# Patient Record
Sex: Female | Born: 1950 | Race: White | Hispanic: No | Marital: Married | Smoking: Current every day smoker
Health system: Southern US, Community
[De-identification: ages and names within clinical notes are randomized; demographics above are authoritative.]

## PROBLEM LIST (undated history)

## (undated) DIAGNOSIS — T7840XA Allergy, unspecified, initial encounter: Secondary | ICD-10-CM

## (undated) DIAGNOSIS — F32A Depression, unspecified: Secondary | ICD-10-CM

## (undated) DIAGNOSIS — K635 Polyp of colon: Secondary | ICD-10-CM

## (undated) DIAGNOSIS — F419 Anxiety disorder, unspecified: Secondary | ICD-10-CM

## (undated) DIAGNOSIS — F329 Major depressive disorder, single episode, unspecified: Secondary | ICD-10-CM

## (undated) HISTORY — DX: Polyp of colon: K63.5

## (undated) HISTORY — DX: Allergy, unspecified, initial encounter: T78.40XA

## (undated) HISTORY — DX: Anxiety disorder, unspecified: F41.9

---
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
## 1972-03-19 HISTORY — PX: GALLBLADDER SURGERY: SHX652

## 2004-06-05 ENCOUNTER — Ambulatory Visit: Payer: Self-pay | Admitting: Internal Medicine

## 2005-08-28 ENCOUNTER — Ambulatory Visit: Payer: Self-pay | Admitting: Internal Medicine

## 2007-10-15 ENCOUNTER — Ambulatory Visit: Payer: Self-pay | Admitting: Internal Medicine

## 2007-10-18 ENCOUNTER — Ambulatory Visit: Payer: Self-pay | Admitting: Internal Medicine

## 2007-10-27 ENCOUNTER — Ambulatory Visit: Payer: Self-pay | Admitting: Internal Medicine

## 2007-12-05 ENCOUNTER — Ambulatory Visit: Payer: Self-pay | Admitting: Urology

## 2007-12-08 ENCOUNTER — Ambulatory Visit: Payer: Self-pay | Admitting: Gastroenterology

## 2010-04-05 ENCOUNTER — Ambulatory Visit: Payer: Self-pay | Admitting: Internal Medicine

## 2011-04-24 ENCOUNTER — Ambulatory Visit: Payer: Self-pay | Admitting: Internal Medicine

## 2011-06-26 ENCOUNTER — Ambulatory Visit: Payer: Self-pay | Admitting: Gastroenterology

## 2011-06-29 LAB — PATHOLOGY REPORT

## 2015-08-29 DIAGNOSIS — Z720 Tobacco use: Secondary | ICD-10-CM | POA: Insufficient documentation

## 2016-08-22 LAB — HM HEPATITIS C SCREENING LAB: HM Hepatitis Screen: NEGATIVE

## 2017-08-20 DIAGNOSIS — M5136 Other intervertebral disc degeneration, lumbar region: Secondary | ICD-10-CM | POA: Diagnosis not present

## 2017-08-20 DIAGNOSIS — M9903 Segmental and somatic dysfunction of lumbar region: Secondary | ICD-10-CM | POA: Diagnosis not present

## 2017-08-20 DIAGNOSIS — M9901 Segmental and somatic dysfunction of cervical region: Secondary | ICD-10-CM | POA: Diagnosis not present

## 2017-09-03 ENCOUNTER — Other Ambulatory Visit: Payer: Self-pay

## 2017-09-03 ENCOUNTER — Ambulatory Visit: Payer: Medicare Other | Admitting: Family Medicine

## 2017-09-03 ENCOUNTER — Encounter: Payer: Self-pay | Admitting: Family Medicine

## 2017-09-03 VITALS — BP 136/57 | HR 72 | Temp 98.2°F | Ht 63.0 in | Wt 147.6 lb

## 2017-09-03 DIAGNOSIS — F3342 Major depressive disorder, recurrent, in full remission: Secondary | ICD-10-CM | POA: Diagnosis not present

## 2017-09-03 DIAGNOSIS — E7800 Pure hypercholesterolemia, unspecified: Secondary | ICD-10-CM

## 2017-09-03 DIAGNOSIS — Z7689 Persons encountering health services in other specified circumstances: Secondary | ICD-10-CM | POA: Diagnosis not present

## 2017-09-03 NOTE — Patient Instructions (Addendum)
Thank you for coming to the office today.  Keep taking Trazodone 100mg  nightly - when down to last 1-2 weeks of pills - please call OUR OFFICE to request a new rx sent to [facility]  We will re-discuss Pneumonia Vaccine - Prevnar-13 at next visit please consider this  Also we will plan to order Mammogram and Colonoscopy at next visit   DUE for FASTING BLOOD WORK (no food or drink after midnight before the lab appointment, only water or coffee without cream/sugar on the morning of)  SCHEDULE "Lab Only" visit in the morning at the clinic for lab draw [place]  - Make sure Lab Only appointment is at about 1 week before your next appointment, so that results will be available  For Lab Results, once available within 2-3 days of blood draw, you can can log in to MyChart online to view your results and a brief explanation. Also, we can discuss results at next follow-up visit.   Please schedule a Follow-up Appointment to: Return in about 2 weeks (around 09/17/2017) for Annual Physical.  If you have any other questions or concerns, please feel free to call the office or send a message through [place]. You may also schedule an earlier appointment if necessary.  Additionally, you may be receiving a survey about your experience at our office within a few days to 1 week by e-mail or mail. We value your feedback.  Nobie Putnam, DO [place]

## 2017-09-03 NOTE — Progress Notes (Signed)
Subjective:    Patient ID: Brittany Weeks, female    DOB: 1951-02-08, 67 y.o.   MRN: [id number]  Brittany Weeks is a 67 y.o. female presenting on 09/03/2017 for Establish Care (transition care from Dr Ginette Pitman ) and Insomnia  Establishing with new PCP here at [facility]. Patient preference transition from previous PCP Dr Ginette Pitman at [facility].  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 In past she tried Paxil in past did not do well on it, eventually switched tried Wellbutrin, Prozac - only temporary improvement and then would stop meds and symptoms would return with recurrent depression - Now she has been significantly well controlled on Trazodone 100mg  nightly, she wants to switch pharmacy to  - Poor sleep without Trazodone, has difficulty falling asleep - Not established with Psychiatry or Counselor / Therapist - not interested - Not due for refill at this time, has enough med - will notify when ready - Denies worsening mood, insomnia, anxiety, suicidal or homicidal ideation  HYPERLIPIDEMIA: - Reports concerns that she does not want to start cholesterol med. Last lipid panel 08/22/16, mild elevated total chol and LDL 133 - Not taking statin   Additional history - Previously treated 1 month ago with Amoxicillin about 1 month ago with abscess tooth, with history root canal - Admits some mild lower ankle swelling on outside of ankle bones, otherwise no swelling, has history of arthritis thinks that this is what is causing this, has some varicose/spider veins as well  Health Maintenance:  Decline TDAP  She will consider pneumonia vaccine / prevnar-13 now age >10  Colonoscopy last 2013 - had some hyperplastic polyps by path report - no  colonoscopy report available, previously performed by Dr Gustavo Lah at [facility]. She is overdue for next screening, will request repeat colonoscopy at next visit  Breast CA Screening: Due for mammogram screening. Last mammogram result normal (04/24/11) done at [facility]  program. No known family history of breast cancer. Currently asymptomatic.  Reported history of prior Pap smear 2016, DEXA 2014.   Depression screen [facility] 2/9 09/03/2017  Decreased Interest 0  Down, Depressed, Hopeless 0  PHQ - 2 Score 0  Altered sleeping 0  Tired, decreased energy 0  Change in appetite 0  Feeling bad or failure about yourself  0  Trouble concentrating 0  Moving slowly or fidgety/restless 0  Suicidal thoughts 0  PHQ-9 Score 0  Difficult doing work/chores Not difficult at all   GAD 7 : Generalized Anxiety Score 09/03/2017  Nervous, Anxious, on Edge 0  Control/stop worrying 0  Worry too much - different things 0  Trouble relaxing 0  Restless 0  Easily annoyed or irritable 0  Afraid - awful might happen 0  Total GAD 7 Score 0  Anxiety Difficulty Not difficult at all     Past Medical History:  Diagnosis Date  . Allergy   . Anxiety   . Colon polyps    Past Surgical History:  Procedure Laterality Date  . GALLBLADDER SURGERY  1974  . TONSILLECTOMY Bilateral 1960   Social History   Socioeconomic History  . Marital status: Married    Spouse name: Not on file  . Number of children: Not on file  . Years of education: High  School  . Highest education level: High school graduate  Occupational History  . Not on file  Social Needs  . Financial resource strain: Not on file  . Food insecurity:    Worry: Not on file    Inability: Not on file  . Transportation needs:    Medical: Not on file    Non-medical: Not on file  Tobacco Use  . Smoking status: Current Every Day Smoker    Packs/day: 1.00    Years: 50.00    Pack years: 50.00  . Smokeless tobacco: Never Used  Substance and Sexual Activity  .  Alcohol use: Not on file  . Drug use: Not on file  . Sexual activity: Not on file  Lifestyle  . Physical activity:    Days per week: Not on file    Minutes per session: Not on file  . Stress: Not on file  Relationships  . Social connections:    Talks on phone: Not on file    Gets together: Not on file    Attends religious service: Not on file    Active member of club or organization: Not on file    Attends meetings of clubs or organizations: Not on file    Relationship status: Not on file  . Intimate partner violence:    Fear of current or ex partner: Not on file    Emotionally abused: Not on file    Physically abused: Not on file    Forced sexual activity: Not on file  Other Topics Concern  . Not on file  Social History Narrative  . Not on file   Family History  Problem Relation Age of Onset  . Leukemia Mother   . Dementia Father    Current Outpatient Medications on File Prior to Visit  Medication Sig  . traZODone (DESYREL) 100 MG tablet TAKE 1 TABLET BY MOUTH EVERY DAY AT NIGHT   No current facility-administered medications on file prior to visit.     Review of Systems Per HPI unless specifically indicated above     Objective:    BP (!) 136/57 (BP Location: Right Arm, Patient Position: Sitting, Cuff Size: Normal)   Pulse 72   Temp 98.2 F (36.8 C) (Oral)   Ht 5\' 3"  (1.6 m)   Wt 147 lb 9.6 oz (67 kg)   BMI 26.15 kg/m   Wt Readings from Last 3 Encounters:  09/03/17 147 lb 9.6 oz (67 kg)    Physical Exam  Constitutional: She is oriented to person, place, and time. She appears well-developed and well-nourished. No distress.  Well-appearing, comfortable, cooperative  HENT:  Head: Normocephalic and atraumatic.  Mouth/Throat: Oropharynx is clear and moist.  Eyes: Conjunctivae are normal. Right eye exhibits no discharge. Left eye exhibits no discharge.  Cardiovascular: Normal rate and intact distal pulses.  Pulmonary/Chest: Effort normal and breath sounds  normal. She has no wheezes. She has no rales.  Musculoskeletal: She exhibits no edema (except mild trace non pitting soft edema localized only around lateral malleolus bilateral).  Neurological: She is alert and oriented to person, place, and time.  Skin: Skin is warm and dry. No rash noted. She is not diaphoretic. No erythema.  Evidnece of mild spider and varicose veins lower ext  Psychiatric: She has a normal mood and affect. Her behavior is normal.  Well groomed, good eye contact, normal speech and thoughts  Nursing note and vitals reviewed.  Results for orders placed or performed in visit on 09/03/17  HM  HEPATITIS C SCREENING LAB  Result Value Ref Range   HM Hepatitis Screen Negative-Validated       Assessment & Plan:   Problem List Items Addressed This Visit    Hyperlipidemia    Prior lab elevated LDL, seems inadequately controlled on lifestyle, other numbers were good on outside labs - Order future fasting lab for yearly comparison - Follow-up at annual to review - already she has preferred to avoid statin, will calculate ASCVD risk and re-discuss      Major depression, recurrent, full remission ([place]) - Primary    Stable well controlled on current therapy Insomnia seems secondary to mood/anxiety - also controlled PHQ and GAD score 0 Not followed by Psych or Therapist  Plan Continue Trazodone 100mg  nightly monotherapy Will refill when patient ready - avoid confusion will not send rx now, she has switched PCP and pharmacy now       Relevant Medications   traZODone (DESYREL) 100 MG tablet    Other Visit Diagnoses    Encounter to establish care with new doctor        Will review outside PCP records from [facility], no colonoscopy report is available, will either request or anticipate she will need new order soon and will update this    #LE Edema mild Briefly reviewed likely this is due to varicose veins/spider veins some insufficiency vs possible arthritis causing  localized surrounding joint/soft tissue swelling, seems benign with improve with rest elevation and worse if more active. - Follow-up course in future - consider more aggressive RICE therapy interventions, prefers to avoid medication  No orders of the defined types were placed in this encounter.   Follow up plan: Return in about 2 weeks (around 09/17/2017) for Annual Physical.  Future orders placed for 09/11/17  Nobie Putnam, [facility] 09/04/2017, 12:10 AM

## 2017-09-04 ENCOUNTER — Encounter: Payer: Self-pay | Admitting: Family Medicine

## 2017-09-04 ENCOUNTER — Other Ambulatory Visit: Payer: Self-pay | Admitting: Family Medicine

## 2017-09-04 DIAGNOSIS — E785 Hyperlipidemia, unspecified: Secondary | ICD-10-CM | POA: Insufficient documentation

## 2017-09-04 DIAGNOSIS — Z0000 Encounter for general adult medical examination without abnormal findings: Secondary | ICD-10-CM

## 2017-09-04 DIAGNOSIS — E782 Mixed hyperlipidemia: Secondary | ICD-10-CM | POA: Insufficient documentation

## 2017-09-04 DIAGNOSIS — E7800 Pure hypercholesterolemia, unspecified: Secondary | ICD-10-CM

## 2017-09-04 DIAGNOSIS — F3342 Major depressive disorder, recurrent, in full remission: Secondary | ICD-10-CM

## 2017-09-04 DIAGNOSIS — F5104 Psychophysiologic insomnia: Secondary | ICD-10-CM

## 2017-09-04 NOTE — Assessment & Plan Note (Signed)
Prior lab elevated LDL, seems inadequately controlled on lifestyle, other numbers were good on outside labs - Order future fasting lab for yearly comparison - Follow-up at annual to review - already she has preferred to avoid statin, will calculate ASCVD risk and re-discuss

## 2017-09-04 NOTE — Assessment & Plan Note (Signed)
Stable well controlled on current therapy Insomnia seems secondary to mood/anxiety - also controlled PHQ and GAD score 0 Not followed by Psych or Therapist  Plan Continue Trazodone 100mg  nightly monotherapy Will refill when patient ready - avoid confusion will not send rx now, she has switched PCP and pharmacy now

## 2017-09-11 ENCOUNTER — Other Ambulatory Visit: Payer: Medicare Other

## 2017-09-17 DIAGNOSIS — M9901 Segmental and somatic dysfunction of cervical region: Secondary | ICD-10-CM | POA: Diagnosis not present

## 2017-09-17 DIAGNOSIS — M9903 Segmental and somatic dysfunction of lumbar region: Secondary | ICD-10-CM | POA: Diagnosis not present

## 2017-09-17 DIAGNOSIS — M5136 Other intervertebral disc degeneration, lumbar region: Secondary | ICD-10-CM | POA: Diagnosis not present

## 2017-09-18 ENCOUNTER — Encounter: Payer: Medicare Other | Admitting: Family Medicine

## 2017-09-25 ENCOUNTER — Other Ambulatory Visit: Payer: Medicare Other

## 2017-09-25 DIAGNOSIS — R7309 Other abnormal glucose: Secondary | ICD-10-CM | POA: Diagnosis not present

## 2017-09-25 DIAGNOSIS — F3342 Major depressive disorder, recurrent, in full remission: Secondary | ICD-10-CM

## 2017-09-25 DIAGNOSIS — R7301 Impaired fasting glucose: Secondary | ICD-10-CM | POA: Diagnosis not present

## 2017-09-25 DIAGNOSIS — E7800 Pure hypercholesterolemia, unspecified: Secondary | ICD-10-CM | POA: Diagnosis not present

## 2017-09-25 DIAGNOSIS — Z0000 Encounter for general adult medical examination without abnormal findings: Secondary | ICD-10-CM | POA: Diagnosis not present

## 2017-09-26 LAB — CBC WITH DIFFERENTIAL/PLATELET
BASOS PCT: 0.8 %
Basophils Absolute: 62 cells/uL (ref 0–200)
EOS PCT: 1.9 %
Eosinophils Absolute: 148 cells/uL (ref 15–500)
HCT: 44.6 % (ref 35.0–45.0)
Hemoglobin: 14.6 g/dL (ref 11.7–15.5)
Lymphs Abs: 3151 cells/uL (ref 850–3900)
MCH: 32.3 pg (ref 27.0–33.0)
MCHC: 32.7 g/dL (ref 32.0–36.0)
MCV: 98.7 fL (ref 80.0–100.0)
MONOS PCT: 5.6 %
MPV: 10.6 fL (ref 7.5–12.5)
Neutro Abs: 4001 cells/uL (ref 1500–7800)
Neutrophils Relative %: 51.3 %
PLATELETS: 279 10*3/uL (ref 140–400)
RBC: 4.52 10*6/uL (ref 3.80–5.10)
RDW: 12.6 % (ref 11.0–15.0)
TOTAL LYMPHOCYTE: 40.4 %
WBC: 7.8 10*3/uL (ref 3.8–10.8)
WBCMIX: 437 {cells}/uL (ref 200–950)

## 2017-09-26 LAB — COMPLETE METABOLIC PANEL WITH GFR
AG Ratio: 2 (calc) (ref 1.0–2.5)
ALT: 10 U/L (ref 6–29)
AST: 14 U/L (ref 10–35)
Albumin: 4.5 g/dL (ref 3.6–5.1)
Alkaline phosphatase (APISO): 70 U/L (ref 33–130)
BUN: 13 mg/dL (ref 7–25)
CALCIUM: 9.5 mg/dL (ref 8.6–10.4)
CHLORIDE: 103 mmol/L (ref 98–110)
CO2: 28 mmol/L (ref 20–32)
Creat: 0.79 mg/dL (ref 0.50–0.99)
GFR, EST AFRICAN AMERICAN: 89 mL/min/{1.73_m2} (ref >=60)
GFR, Est Non African American: 77 mL/min/{1.73_m2} (ref >=60)
GLUCOSE: 103 mg/dL — AB (ref 65–99)
Globulin: 2.3 g/dL (calc) (ref 1.9–3.7)
Potassium: 4.7 mmol/L (ref 3.5–5.3)
Sodium: 140 mmol/L (ref 135–146)
TOTAL PROTEIN: 6.8 g/dL (ref 6.1–8.1)
Total Bilirubin: 0.4 mg/dL (ref 0.2–1.2)

## 2017-09-26 LAB — LIPID PANEL
CHOL/HDL RATIO: 3.4 (calc) (ref <5.0)
Cholesterol: 218 mg/dL — ABNORMAL HIGH (ref <200)
HDL: 64 mg/dL (ref >50)
LDL Cholesterol (Calc): 129 mg/dL (calc) — ABNORMAL HIGH
NON-HDL CHOLESTEROL (CALC): 154 mg/dL — AB (ref <130)
Triglycerides: 139 mg/dL (ref <150)

## 2017-09-26 LAB — HEMOGLOBIN A1C
EAG (MMOL/L): 5.8 (calc)
Hgb A1c MFr Bld: 5.3 % of total Hgb (ref <5.7)
MEAN PLASMA GLUCOSE: 105 (calc)

## 2017-10-02 ENCOUNTER — Encounter: Payer: Self-pay | Admitting: Family Medicine

## 2017-10-02 ENCOUNTER — Ambulatory Visit (INDEPENDENT_AMBULATORY_CARE_PROVIDER_SITE_OTHER): Payer: Medicare Other | Admitting: Family Medicine

## 2017-10-02 VITALS — BP 126/46 | HR 69 | Temp 99.2°F | Resp 16 | Ht 63.0 in | Wt 149.0 lb

## 2017-10-02 DIAGNOSIS — Z1239 Encounter for other screening for malignant neoplasm of breast: Secondary | ICD-10-CM

## 2017-10-02 DIAGNOSIS — Z23 Encounter for immunization: Secondary | ICD-10-CM | POA: Diagnosis not present

## 2017-10-02 DIAGNOSIS — F3342 Major depressive disorder, recurrent, in full remission: Secondary | ICD-10-CM | POA: Diagnosis not present

## 2017-10-02 DIAGNOSIS — E7800 Pure hypercholesterolemia, unspecified: Secondary | ICD-10-CM | POA: Diagnosis not present

## 2017-10-02 DIAGNOSIS — Z0000 Encounter for general adult medical examination without abnormal findings: Secondary | ICD-10-CM

## 2017-10-02 DIAGNOSIS — Z720 Tobacco use: Secondary | ICD-10-CM

## 2017-10-02 DIAGNOSIS — Z1231 Encounter for screening mammogram for malignant neoplasm of breast: Secondary | ICD-10-CM

## 2017-10-02 NOTE — Patient Instructions (Addendum)
Thank you for coming to the office today.  You will receive [id number] initial pneumonia vaccine today - then next due for booster vaccine in 1 year - Pneumovax-23 then done  Please call [facility] - Dr Marton Redwood office to schedule next routine screening colonoscopy - last one had polyps back in 2013  [address] [place], [place] [address] Hours: 8AM-5PM Phone: [phone]  ------------------------------  For Mammogram screening for breast cancer   DEXA Scan (Bone mineral density) screening for osteoporosis  Call the [place] below anytime to schedule your own appointment now that order has been placed.  [facility] [address], [place] [address] Phone: [phone]  When running low on Trazodone about 1-2 weeks before out of rx - call our office request a refill Trazodone 100mg  nightly - 90 day supply to [place] Dose Chest CT Lung CA Screening - Age 18-74 - Smoking history >30 pack years  [facility]) [place], RN, OCN [phone]  [place] Smoking Cessation Class Ph: [phone]  DUE for FASTING BLOOD WORK (no food or drink after midnight before the lab appointment, only water or coffee without cream/sugar on the morning of)  SCHEDULE "Lab Only" visit in the morning at the clinic for lab draw in 1 YEAR  - Make sure Lab Only appointment is at about 1 week before your next appointment, so that results will be available  For Lab Results, once available within 2-3 days of blood draw, you can can log in to MyChart online to view your results and a brief explanation. Also, we can discuss results at next follow-up visit.  Please schedule a Follow-up Appointment to: Return in about 1 year (around 10/03/2018) for Annual Physical.  If you have any other questions or concerns, please feel free to call the office or send a  message through [place]. You may also schedule an earlier appointment if necessary.  Additionally, you may be receiving a survey about your experience at our office within a few days to 1 week by e-mail or mail. We value your feedback.  Nobie Putnam, DO [place]

## 2017-10-02 NOTE — Progress Notes (Signed)
Subjective:    Patient ID: Brittany Weeks, female    DOB: Dec 25, 1950, 67 y.o.   MRN: [id number]  Brittany Weeks is a 67 y.o. 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 In past she tried Paxil in past did not do well on it, eventually switched tried Wellbutrin, Prozac - only temporary improvement and then would stop meds and symptoms would return with recurrent depression - Today reports no new concern. Doing well on Trazodone 100mg  nightly - Not established with Psychiatry or Counselor / Therapist - not interested  HYPERLIPIDEMIA: Last lipid 09/2017, elevated LDL mild, otherwise normal HDL and slightly high total chol Declines statin therapy Admits improved healthy diet, except does eat dessert often  Tobacco Abuse Chronic history smoker 1ppd >50 years. Not interested in quitting. Has not had lung CA screening with CT, not interested at this time.  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 Resolved after seen a Chiropractor, she thinks it was more "spine out of line". Sciatic nerve x 2-3 flare ups, followed with chiropractor, completed treatment course Followed by [facility] Chiropractor  Additional complaint - She had a root canal procedure on Lower Left side of jaw in May 2019, they used different technique, and now she has problem with feeling a "knot" on her jaw, initial mild swelling now  improving, she will return to Dentist   Health Maintenance:  Decline TDAP  Cervical Cancer Screening - no longer due, s/p last pap at age 71 negative, declines further screening  Due for initial pneumonia vaccine at age 38 - will receive Prevnar-13 today  Colonoscopy last 2013 - had some hyperplastic polyps by path report - no colonoscopy report available, previously performed by Dr Gustavo Lah at [facility]. She is overdue for next screening, will request repeat colonoscopy now she will contact them to schedule  Breast CA Screening: Due for mammogram screening. Last mammogram result normal (04/24/11) done at [facility]  program. No known family history of breast cancer. Currently asymptomatic.  Last DEXA 2014, she was previously treated with Fosamax back in 2004. Now declines further treatment or testing.   Depression screen [facility] 2/9 10/02/2017 09/03/2017  Decreased Interest 0 0  Down, Depressed, Hopeless 0 0  PHQ - 2 Score 0 0  Altered sleeping 0 0  Tired, decreased energy 0 0  Change in appetite 0 0  Feeling bad or failure about yourself  0 0  Trouble concentrating 0 0  Moving slowly or fidgety/restless 0 0  Suicidal thoughts 0 0  PHQ-9 Score 0 0  Difficult doing work/chores Not difficult at all Not difficult at all   GAD 7 : Generalized Anxiety Score 10/02/2017 09/03/2017  Nervous, Anxious, on Edge 0 0  Control/stop worrying 0 0  Worry too much - different things 0 0  Trouble relaxing 0 0  Restless 0 0  Easily annoyed or irritable 0 0  Afraid - awful might happen 0  0  Total GAD 7 Score 0 0  Anxiety Difficulty Not difficult at all Not difficult at all    Past Medical History:  Diagnosis Date  . Allergy   . Anxiety   . Colon polyps    Past Surgical History:  Procedure Laterality Date  . GALLBLADDER SURGERY  1974  . TONSILLECTOMY Bilateral 1960   Social History   Socioeconomic History  . Marital status: Married    Spouse name: Not on file  . Number of children: Not on file    . Years of education: [employer]  . Highest education level: High school graduate  Occupational History  . Not on file  Social Needs  . Financial resource strain: Not on file  . Food insecurity:    Worry: Not on file    Inability: Not on file  . Transportation needs:    Medical: Not on file    Non-medical: Not on file  Tobacco Use  . Smoking status: Current Every Day Smoker    Packs/day: 1.00    Years: 50.00    Pack years: 50.00  . Smokeless tobacco: Never Used  Substance and Sexual Activity  . Alcohol use: Not on file  . Drug use: Not on file  . Sexual activity: Not on file  Lifestyle  . Physical activity:    Days per week: Not on file    Minutes per session: Not on file  . Stress: Not on file  Relationships  . Social connections:    Talks on phone: Not on file    Gets together: Not on file    Attends religious service: Not on file    Active member of club or organization: Not on file    Attends meetings of clubs or organizations: Not on file    Relationship status: Not on file  . Intimate partner violence:    Fear of current or ex partner: Not on file    Emotionally abused: Not on file    Physically abused: Not on file    Forced sexual activity: Not on file  Other Topics Concern  . Not on file  Social History Narrative  . Not on file   Family History  Problem Relation Age of Onset  . Leukemia Mother   . Dementia Father    Current Outpatient Medications on File Prior to Visit  Medication Sig  . traZODone (DESYREL) 100 MG tablet TAKE 1 TABLET BY MOUTH EVERY DAY AT NIGHT   No current facility-administered medications on file prior to visit.     Review of Systems  Constitutional: Negative for activity change, appetite change, chills, diaphoresis, fatigue and fever.  HENT: Negative for congestion and hearing loss.   Eyes: Negative for visual disturbance.  Respiratory: Negative for apnea, cough, chest tightness, shortness of breath and wheezing.    Cardiovascular: Negative for chest pain, palpitations and leg swelling.  Gastrointestinal: Negative for abdominal pain, anal bleeding, blood in stool, constipation, diarrhea, nausea and vomiting.  Endocrine: Negative for cold intolerance.  Genitourinary: Negative for difficulty urinating, dysuria, frequency, hematuria and urgency.  Musculoskeletal: Negative for arthralgias, back pain and neck pain.  Skin: Negative for rash.  Allergic/Immunologic: Negative for environmental allergies.  Neurological: Negative for dizziness, weakness, light-headedness, numbness and headaches.  Hematological: Negative for adenopathy.  Psychiatric/Behavioral: Positive for sleep disturbance (controlled on med). Negative for behavioral problems and dysphoric mood. The patient is not nervous/anxious.    Per HPI unless specifically indicated above     Objective:  BP (!) 126/46   Pulse 69   Temp 99.2 F (37.3 C) (Oral)   Resp 16   Ht 5\' 3"  (1.6 m)   Wt 149 lb (67.6 kg)   BMI 26.39 kg/m   Wt Readings from Last 3 Encounters:  10/02/17 149 lb (67.6 kg)  09/03/17 147 lb 9.6 oz (67 kg)    Physical Exam  Constitutional: She is oriented to person, place, and time. She appears well-developed and well-nourished. No distress.  Well-appearing, comfortable, cooperative  HENT:  Head: Normocephalic and atraumatic.  Mouth/Throat: Oropharynx is clear and moist.  Eyes: Pupils are equal, round, and reactive to light. Conjunctivae and EOM are normal. Right eye exhibits no discharge. Left eye exhibits no discharge.  Neck: Normal range of motion. Neck supple. No thyromegaly present.  Cardiovascular: Normal rate, regular rhythm, normal heart sounds and intact distal pulses.  No murmur heard. Pulmonary/Chest: Effort normal and breath sounds normal. No respiratory distress. She has no wheezes. She has no rales.  Abdominal: Soft. Bowel sounds are normal. She exhibits no distension and no mass. There is no tenderness.   Musculoskeletal: Normal range of motion. She exhibits no edema (trace edema ankles only) or tenderness.  Upper / Lower Extremities: - Normal muscle tone, strength bilateral upper extremities 5/5, lower extremities 5/5  Lymphadenopathy:    She has no cervical adenopathy.  Neurological: She is alert and oriented to person, place, and time.  Distal sensation intact to light touch all extremities  Skin: Skin is warm and dry. No rash noted. She is not diaphoretic. No erythema.  Evidnece of mild spider and varicose veins lower ext  Psychiatric: She has a normal mood and affect. Her behavior is normal.  Well groomed, good eye contact, normal speech and thoughts  Nursing note and vitals reviewed.  Results for orders placed or performed in visit on 09/25/17  Lipid panel  Result Value Ref Range   Cholesterol 218 (H) <200 mg/dL   HDL 64 >50 mg/dL   Triglycerides 139 <150 mg/dL   LDL Cholesterol (Calc) 129 (H) mg/dL (calc)   Total CHOL/HDL Ratio 3.4 <5.0 (calc)   Non-HDL Cholesterol (Calc) 154 (H) <130 mg/dL (calc)  COMPLETE METABOLIC PANEL WITH GFR  Result Value Ref Range   Glucose, Bld 103 (H) 65 - 99 mg/dL   BUN 13 7 - 25 mg/dL   Creat 0.79 0.50 - 0.99 mg/dL   GFR, Est Non African American 77 > OR = 60 mL/min/1.41m2   GFR, Est African American 89 > OR = 60 mL/min/1.33m2   BUN/Creatinine Ratio NOT APPLICABLE 6 - 22 (calc)   Sodium 140 135 - 146 mmol/L   Potassium 4.7 3.5 - 5.3 mmol/L   Chloride 103 98 - 110 mmol/L   CO2 28 20 - 32 mmol/L   Calcium 9.5 8.6 - 10.4 mg/dL   Total Protein 6.8 6.1 - 8.1 g/dL   Albumin 4.5 3.6 - 5.1 g/dL   Globulin 2.3 1.9 - 3.7 g/dL (calc)   AG Ratio 2.0 1.0 - 2.5 (calc)   Total Bilirubin 0.4 0.2 - 1.2 mg/dL   Alkaline phosphatase (APISO) 70 33 - 130 U/L   AST 14 10 - 35 U/L   ALT 10 6 - 29 U/L  CBC with Differential/Platelet  Result Value Ref Range   WBC 7.8 3.8 - 10.8 Thousand/uL   RBC 4.52 3.80 - 5.10 Million/uL   Hemoglobin 14.6 11.7 - 15.5 g/dL    HCT 44.6 35.0 - 45.0 %  MCV 98.7 80.0 - 100.0 fL   MCH 32.3 27.0 - 33.0 pg   MCHC 32.7 32.0 - 36.0 g/dL   RDW 12.6 11.0 - 15.0 %   Platelets 279 140 - 400 Thousand/uL   MPV 10.6 7.5 - 12.5 fL   Neutro Abs 4,001 1,500 - 7,800 cells/uL   Lymphs Abs 3,151 850 - 3,900 cells/uL   WBC mixed population 437 200 - 950 cells/uL   Eosinophils Absolute 148 15 - 500 cells/uL   Basophils Absolute 62 0 - 200 cells/uL   Neutrophils Relative % 51.3 %   Total Lymphocyte 40.4 %   Monocytes Relative 5.6 %   Eosinophils Relative 1.9 %   Basophils Relative 0.8 %  Hemoglobin A1c  Result Value Ref Range   Hgb A1c MFr Bld 5.3 <5.7 % of total Hgb   Mean Plasma Glucose 105 (calc)   eAG (mmol/L) 5.8 (calc)      Assessment & Plan:   Problem List Items Addressed This Visit    Hyperlipidemia    Mildly Uncontrolled cholesterol elevated LDL but good HDL, improving lifestyle Last lipid panel 09/2017 Calculated ASCVD 10 yr risk score 13.2% (due to smoking, otherwise down to 7.8%)  Plan: 1. Counseling on ASCVD risk reduction with statin - she declines at this time. 2. Encourage improved lifestyle - low carb/cholesterol, reduce portion size, continue improving regular exercise      Major depression, recurrent, full remission ([place])    Stable well controlled on current therapy Insomnia seems secondary to mood/anxiety - also controlled PHQ and GAD score 0 Not followed by Psych or Therapist  Plan Continue Trazodone 100mg  nightly monotherapy Refill rx when ready Follow-up      Tobacco abuse    Counseling on risks of smoking Not interested in quitting No prior successful quit attempts Recommend future Low Dose CT Lung CA Screening - she is not interested at this time, handout given       Other Visit Diagnoses    Annual physical exam    -  Primary Updated Health Maintenance information - Ordered mammogram, patient to schedule - Recommended she contact [facility] for repeat colonoscopy - Declined  DEXA - Prevnar-13 given today Reviewed recent lab results with patient Encouraged improvement to lifestyle with diet and exercise    Screening for breast cancer       Relevant Orders   MM DIGITAL SCREENING BILATERAL   Need for vaccination with 13-polyvalent pneumococcal conjugate vaccine       Relevant Orders   Pneumococcal conjugate vaccine 13-valent IM (Completed)      No orders of the defined types were placed in this encounter.    Follow up plan: Return in about 1 year (around 10/03/2018) for Annual Physical.  Future labs ordered for 09/30/18  Nobie Putnam, [facility] [facility] 10/03/2017, 1:18 AM

## 2017-10-03 ENCOUNTER — Other Ambulatory Visit: Payer: Self-pay | Admitting: Family Medicine

## 2017-10-03 DIAGNOSIS — Z0000 Encounter for general adult medical examination without abnormal findings: Secondary | ICD-10-CM

## 2017-10-03 DIAGNOSIS — Z720 Tobacco use: Secondary | ICD-10-CM | POA: Insufficient documentation

## 2017-10-03 DIAGNOSIS — E7800 Pure hypercholesterolemia, unspecified: Secondary | ICD-10-CM

## 2017-10-03 DIAGNOSIS — R7309 Other abnormal glucose: Secondary | ICD-10-CM

## 2017-10-03 DIAGNOSIS — F3342 Major depressive disorder, recurrent, in full remission: Secondary | ICD-10-CM

## 2017-10-03 NOTE — Assessment & Plan Note (Signed)
Counseling on risks of smoking Not interested in quitting No prior successful quit attempts Recommend future Low Dose CT Lung CA Screening - she is not interested at this time, handout given

## 2017-10-03 NOTE — Assessment & Plan Note (Signed)
Stable well controlled on current therapy Insomnia seems secondary to mood/anxiety - also controlled PHQ and GAD score 0 Not followed by Psych or Therapist  Plan Continue Trazodone 100mg  nightly monotherapy Refill rx when ready Follow-up

## 2017-10-03 NOTE — Assessment & Plan Note (Signed)
Mildly Uncontrolled cholesterol elevated LDL but good HDL, improving lifestyle Last lipid panel 09/2017 Calculated ASCVD 10 yr risk score 13.2% (due to smoking, otherwise down to 7.8%)  Plan: 1. Counseling on ASCVD risk reduction with statin - she declines at this time. 2. Encourage improved lifestyle - low carb/cholesterol, reduce portion size, continue improving regular exercise

## 2017-10-22 DIAGNOSIS — M9901 Segmental and somatic dysfunction of cervical region: Secondary | ICD-10-CM | POA: Diagnosis not present

## 2017-10-22 DIAGNOSIS — M9905 Segmental and somatic dysfunction of pelvic region: Secondary | ICD-10-CM | POA: Diagnosis not present

## 2017-10-22 DIAGNOSIS — M9903 Segmental and somatic dysfunction of lumbar region: Secondary | ICD-10-CM | POA: Diagnosis not present

## 2017-10-22 DIAGNOSIS — M5136 Other intervertebral disc degeneration, lumbar region: Secondary | ICD-10-CM | POA: Diagnosis not present

## 2017-10-22 DIAGNOSIS — M40292 Other kyphosis, cervical region: Secondary | ICD-10-CM | POA: Diagnosis not present

## 2017-11-19 DIAGNOSIS — M9901 Segmental and somatic dysfunction of cervical region: Secondary | ICD-10-CM | POA: Diagnosis not present

## 2017-11-19 DIAGNOSIS — M9905 Segmental and somatic dysfunction of pelvic region: Secondary | ICD-10-CM | POA: Diagnosis not present

## 2017-11-19 DIAGNOSIS — M5136 Other intervertebral disc degeneration, lumbar region: Secondary | ICD-10-CM | POA: Diagnosis not present

## 2017-11-19 DIAGNOSIS — M40292 Other kyphosis, cervical region: Secondary | ICD-10-CM | POA: Diagnosis not present

## 2017-11-19 DIAGNOSIS — M9903 Segmental and somatic dysfunction of lumbar region: Secondary | ICD-10-CM | POA: Diagnosis not present

## 2017-11-27 ENCOUNTER — Other Ambulatory Visit: Payer: Self-pay | Admitting: Family Medicine

## 2017-11-27 DIAGNOSIS — F3342 Major depressive disorder, recurrent, in full remission: Secondary | ICD-10-CM

## 2017-11-27 MED ORDER — TRAZODONE HCL 100 MG PO TABS: ORAL_TABLET | ORAL | 3 refills | Status: DC

## 2017-11-27 NOTE — Addendum Note (Signed)
Addended by: Frederich Cha D on: 11/27/2017 04:44 PM   Modules accepted: Orders

## 2017-11-27 NOTE — Telephone Encounter (Signed)
Pt. Called requesting refill on  Trazodone  90 day supply called into  [place]

## 2017-12-17 DIAGNOSIS — M5136 Other intervertebral disc degeneration, lumbar region: Secondary | ICD-10-CM | POA: Diagnosis not present

## 2017-12-17 DIAGNOSIS — M9903 Segmental and somatic dysfunction of lumbar region: Secondary | ICD-10-CM | POA: Diagnosis not present

## 2017-12-17 DIAGNOSIS — M40292 Other kyphosis, cervical region: Secondary | ICD-10-CM | POA: Diagnosis not present

## 2017-12-17 DIAGNOSIS — M9901 Segmental and somatic dysfunction of cervical region: Secondary | ICD-10-CM | POA: Diagnosis not present

## 2017-12-17 DIAGNOSIS — M9905 Segmental and somatic dysfunction of pelvic region: Secondary | ICD-10-CM | POA: Diagnosis not present

## 2018-01-14 DIAGNOSIS — M40292 Other kyphosis, cervical region: Secondary | ICD-10-CM | POA: Diagnosis not present

## 2018-01-14 DIAGNOSIS — M5136 Other intervertebral disc degeneration, lumbar region: Secondary | ICD-10-CM | POA: Diagnosis not present

## 2018-01-14 DIAGNOSIS — M9901 Segmental and somatic dysfunction of cervical region: Secondary | ICD-10-CM | POA: Diagnosis not present

## 2018-01-14 DIAGNOSIS — M9903 Segmental and somatic dysfunction of lumbar region: Secondary | ICD-10-CM | POA: Diagnosis not present

## 2018-01-14 DIAGNOSIS — M9905 Segmental and somatic dysfunction of pelvic region: Secondary | ICD-10-CM | POA: Diagnosis not present

## 2018-02-11 DIAGNOSIS — M9905 Segmental and somatic dysfunction of pelvic region: Secondary | ICD-10-CM | POA: Diagnosis not present

## 2018-02-11 DIAGNOSIS — M9901 Segmental and somatic dysfunction of cervical region: Secondary | ICD-10-CM | POA: Diagnosis not present

## 2018-02-11 DIAGNOSIS — M9903 Segmental and somatic dysfunction of lumbar region: Secondary | ICD-10-CM | POA: Diagnosis not present

## 2018-02-11 DIAGNOSIS — M5136 Other intervertebral disc degeneration, lumbar region: Secondary | ICD-10-CM | POA: Diagnosis not present

## 2018-02-11 DIAGNOSIS — M40292 Other kyphosis, cervical region: Secondary | ICD-10-CM | POA: Diagnosis not present

## 2018-03-10 DIAGNOSIS — M5136 Other intervertebral disc degeneration, lumbar region: Secondary | ICD-10-CM | POA: Diagnosis not present

## 2018-03-10 DIAGNOSIS — M9901 Segmental and somatic dysfunction of cervical region: Secondary | ICD-10-CM | POA: Diagnosis not present

## 2018-03-10 DIAGNOSIS — M9905 Segmental and somatic dysfunction of pelvic region: Secondary | ICD-10-CM | POA: Diagnosis not present

## 2018-03-10 DIAGNOSIS — M40292 Other kyphosis, cervical region: Secondary | ICD-10-CM | POA: Diagnosis not present

## 2018-03-10 DIAGNOSIS — M9903 Segmental and somatic dysfunction of lumbar region: Secondary | ICD-10-CM | POA: Diagnosis not present

## 2018-04-07 DIAGNOSIS — M9905 Segmental and somatic dysfunction of pelvic region: Secondary | ICD-10-CM | POA: Diagnosis not present

## 2018-04-07 DIAGNOSIS — M5136 Other intervertebral disc degeneration, lumbar region: Secondary | ICD-10-CM | POA: Diagnosis not present

## 2018-04-07 DIAGNOSIS — M40292 Other kyphosis, cervical region: Secondary | ICD-10-CM | POA: Diagnosis not present

## 2018-04-07 DIAGNOSIS — M9901 Segmental and somatic dysfunction of cervical region: Secondary | ICD-10-CM | POA: Diagnosis not present

## 2018-04-07 DIAGNOSIS — M9903 Segmental and somatic dysfunction of lumbar region: Secondary | ICD-10-CM | POA: Diagnosis not present

## 2018-05-07 DIAGNOSIS — M5136 Other intervertebral disc degeneration, lumbar region: Secondary | ICD-10-CM | POA: Diagnosis not present

## 2018-05-07 DIAGNOSIS — M9905 Segmental and somatic dysfunction of pelvic region: Secondary | ICD-10-CM | POA: Diagnosis not present

## 2018-05-07 DIAGNOSIS — M40292 Other kyphosis, cervical region: Secondary | ICD-10-CM | POA: Diagnosis not present

## 2018-05-07 DIAGNOSIS — M9903 Segmental and somatic dysfunction of lumbar region: Secondary | ICD-10-CM | POA: Diagnosis not present

## 2018-05-07 DIAGNOSIS — M9901 Segmental and somatic dysfunction of cervical region: Secondary | ICD-10-CM | POA: Diagnosis not present

## 2018-09-30 ENCOUNTER — Other Ambulatory Visit: Payer: Medicare Other

## 2018-09-30 ENCOUNTER — Other Ambulatory Visit: Payer: Self-pay

## 2018-09-30 DIAGNOSIS — R7309 Other abnormal glucose: Secondary | ICD-10-CM

## 2018-09-30 DIAGNOSIS — Z0000 Encounter for general adult medical examination without abnormal findings: Secondary | ICD-10-CM

## 2018-09-30 DIAGNOSIS — F3342 Major depressive disorder, recurrent, in full remission: Secondary | ICD-10-CM

## 2018-09-30 DIAGNOSIS — E7800 Pure hypercholesterolemia, unspecified: Secondary | ICD-10-CM

## 2018-10-01 LAB — COMPLETE METABOLIC PANEL WITH GFR
AG Ratio: 1.8 (calc) (ref 1.0–2.5)
ALT: 11 U/L (ref 6–29)
AST: 13 U/L (ref 10–35)
Albumin: 4.5 g/dL (ref 3.6–5.1)
Alkaline phosphatase (APISO): 72 U/L (ref 37–153)
BUN: 10 mg/dL (ref 7–25)
CO2: 26 mmol/L (ref 20–32)
Calcium: 9.4 mg/dL (ref 8.6–10.4)
Chloride: 105 mmol/L (ref 98–110)
Creat: 0.74 mg/dL (ref 0.50–0.99)
GFR, Est African American: 96 mL/min/{1.73_m2} (ref >=60)
GFR, Est Non African American: 83 mL/min/{1.73_m2} (ref >=60)
Globulin: 2.5 g/dL (calc) (ref 1.9–3.7)
Glucose, Bld: 94 mg/dL (ref 65–99)
Potassium: 4.3 mmol/L (ref 3.5–5.3)
Sodium: 140 mmol/L (ref 135–146)
Total Bilirubin: 0.4 mg/dL (ref 0.2–1.2)
Total Protein: 7 g/dL (ref 6.1–8.1)

## 2018-10-01 LAB — CBC WITH DIFFERENTIAL/PLATELET
Absolute Monocytes: 559 {cells}/uL (ref 200–950)
Basophils Absolute: 60 {cells}/uL (ref 0–200)
Basophils Relative: 0.7 %
Eosinophils Absolute: 181 {cells}/uL (ref 15–500)
Eosinophils Relative: 2.1 %
HCT: 45.7 % — ABNORMAL HIGH (ref 35.0–45.0)
Hemoglobin: 14.7 g/dL (ref 11.7–15.5)
Lymphs Abs: 3638 {cells}/uL (ref 850–3900)
MCH: 32.2 pg (ref 27.0–33.0)
MCHC: 32.2 g/dL (ref 32.0–36.0)
MCV: 100.2 fL — ABNORMAL HIGH (ref 80.0–100.0)
MPV: 10.4 fL (ref 7.5–12.5)
Monocytes Relative: 6.5 %
Neutro Abs: 4162 {cells}/uL (ref 1500–7800)
Neutrophils Relative %: 48.4 %
Platelets: 289 10*3/uL (ref 140–400)
RBC: 4.56 Million/uL (ref 3.80–5.10)
RDW: 12.8 % (ref 11.0–15.0)
Total Lymphocyte: 42.3 %
WBC: 8.6 10*3/uL (ref 3.8–10.8)

## 2018-10-01 LAB — HEMOGLOBIN A1C
Hgb A1c MFr Bld: 5.5 % of total Hgb (ref <5.7)
Mean Plasma Glucose: 111 (calc)
eAG (mmol/L): 6.2 (calc)

## 2018-10-01 LAB — LIPID PANEL
Cholesterol: 214 mg/dL — ABNORMAL HIGH (ref <200)
HDL: 68 mg/dL (ref >=50)
LDL Cholesterol (Calc): 124 mg/dL (calc) — ABNORMAL HIGH
Non-HDL Cholesterol (Calc): 146 mg/dL (calc) — ABNORMAL HIGH (ref <130)
Total CHOL/HDL Ratio: 3.1 (calc) (ref <5.0)
Triglycerides: 112 mg/dL (ref <150)

## 2018-10-07 ENCOUNTER — Ambulatory Visit (INDEPENDENT_AMBULATORY_CARE_PROVIDER_SITE_OTHER): Payer: Medicare Other | Admitting: Family Medicine

## 2018-10-07 ENCOUNTER — Encounter: Payer: Self-pay | Admitting: Family Medicine

## 2018-10-07 ENCOUNTER — Other Ambulatory Visit: Payer: Self-pay | Admitting: Family Medicine

## 2018-10-07 ENCOUNTER — Other Ambulatory Visit: Payer: Self-pay

## 2018-10-07 VITALS — BP 125/53 | HR 77 | Temp 98.4°F | Resp 16 | Ht 63.0 in | Wt 155.0 lb

## 2018-10-07 DIAGNOSIS — Z0000 Encounter for general adult medical examination without abnormal findings: Secondary | ICD-10-CM

## 2018-10-07 DIAGNOSIS — E7800 Pure hypercholesterolemia, unspecified: Secondary | ICD-10-CM

## 2018-10-07 DIAGNOSIS — Z23 Encounter for immunization: Secondary | ICD-10-CM | POA: Diagnosis not present

## 2018-10-07 DIAGNOSIS — Z720 Tobacco use: Secondary | ICD-10-CM | POA: Diagnosis not present

## 2018-10-07 DIAGNOSIS — F3342 Major depressive disorder, recurrent, in full remission: Secondary | ICD-10-CM | POA: Diagnosis not present

## 2018-10-07 MED ORDER — TRAZODONE HCL 100 MG PO TABS: ORAL_TABLET | ORAL | 3 refills | Status: DC

## 2018-10-07 NOTE — Patient Instructions (Addendum)
Thank you for coming to the office today.  1. Chemistry - Normal results, including electrolytes, kidney and liver function. Normal fasting blood sugar   2. Hemoglobin A1c (Diabetes screening) - 5.5, normal not in range of Pre-Diabetes (>5.7 to 6.4)   3. Cholesterol - Mild elevated LDL cholesterol 124, otherwise normal.   4. CBC Blood Counts - Normal, no anemia, other abnormality   Colon Cancer Screening: - For all adults age 69+ routine colon cancer screening is highly recommended.     - Recent guidelines from [place] recommend starting age of 37 - Early detection of colon cancer is important, because often there are no warning signs or symptoms, also if found early usually it can be cured. Late stage is hard to treat.  - If you are not interested in Colonoscopy screening (if done and normal you could be cleared for 5 to 10 years until next due), then Cologuard is an excellent alternative for screening test for Colon Cancer. It is highly sensitive for detecting DNA of colon cancer from even the earliest stages. Also, there is NO bowel prep required. - If Cologuard is NEGATIVE, then it is good for 3 years before next due - If Cologuard is POSITIVE, then it is strongly advised to get a Colonoscopy, which allows the GI doctor to locate the source of the cancer or polyp (even very early stage) and treat it by removing it. ------------------------- If you would like to proceed with Cologuard (stool DNA test) - FIRST, call your insurance company and tell them you want to check cost of Cologuard tell them CPT Code [phone] (it may be completely covered and you could get for no cost, OR max cost without any coverage is about $600). Also, keep in mind if you do NOT open the kit, and decide not to do the test, you will NOT be charged, you should contact the company if you decide not to do the test. - If you want to proceed, you can notify us (phone message, [place], or at next visit)  and we will order it for you. The test kit will be delivered to you house within about 1 week. Follow instructions to collect sample, you may call the company for any help or questions, 24/7 telephone support at [phone].   DUE for FASTING BLOOD WORK (no food or drink after midnight before the lab appointment, only water or coffee without cream/sugar on the morning of)  SCHEDULE "Lab Only" visit in the morning at the clinic for lab draw in 1 YEAR  - Make sure Lab Only appointment is at about 1 week before your next appointment, so that results will be available  For Lab Results, once available within 2-3 days of blood draw, you can can log in to MyChart online to view your results and a brief explanation. Also, we can discuss results at next follow-up visit.    Please schedule a Follow-up Appointment to: Return in about 1 year (around 10/07/2019) for Annual Physical.  If you have any other questions or concerns, please feel free to call the office or send a message through [place]. You may also schedule an earlier appointment if necessary.  Additionally, you may be receiving a survey about your experience at our office within a few days to 1 week by e-mail or mail. We value your feedback.  Nobie Putnam, DO [place]

## 2018-10-07 NOTE — Progress Notes (Signed)
Subjective:    Patient ID: Brittany Weeks, female    DOB: Apr 20, 1950, 68 y.o.   MRN: [id number]  Brittany Weeks is a 68 y.o. 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 In past she tried Paxil in past did not do well on it, eventually switched tried Wellbutrin, Prozac - only temporary improvement and then would stop meds and symptoms would return with recurrent depression - Today reports no new concern. Doing well on Trazodone 100mg  nightly - Not established with Psychiatry or Counselor / Therapist - not interested  HYPERLIPIDEMIA: Last lipid 09/2018, elevated LDL mild, otherwise normal HDL and slightly high total chol Declines statin therapy again. Her husband takes statin and does well on it but she is not ready to take this. Admits improved healthy diet, except does eat dessert often  Tobacco Abuse Chronic history smoker 1ppd >50 years. Not interested in quitting. Has not had lung CA screening with CT, not interested at this time.   Health Maintenance:  Cervical Cancer Screening - no longer due, s/p last pap at age 32 negative, declines further screening  Due for initial pneumonia vaccine at age 68 - will receive booster Pneumvoax-28 today.  Colonoscopy last 2013 - had some hyperplasticpolyps by path report - no colonoscopy report available, previously performed by Dr Gustavo Lah at [facility]. She is overdue for next screening, she delayed until 05/2018 this year but then due to coronavirus pandemic unable to schedule and she declines to go to hospital = Declines cologuard due to hemorrhoids and bleeding at  times.  Breast CA Screening: Due for mammogram screening. [facility] No known family history of breast cancer. Currently asymptomatic. - Declines again for mammogram, due to risk of coronavirus at imaging center  Last DEXA 2014, she was previously treated with Fosamax back in 2004. Now declines further treatment or testing.  Depression screen [facility] 2/9 10/07/2018 10/02/2017 09/03/2017  Decreased Interest 0 0 0  Down, Depressed, Hopeless 0 0 0  PHQ - 2 Score 0 0 0  Altered sleeping 0 0 0  Tired, decreased energy 0 0 0  Change in appetite 0 0 0  Feeling bad or failure about yourself  0 0 0  Trouble concentrating 0 0 0  Moving slowly or fidgety/restless 0 0 0  Suicidal thoughts 0 0 0  PHQ-9 Score 0 0 0  Difficult doing work/chores Not difficult at all Not difficult at all Not difficult at all    GAD 7 : Generalized Anxiety Score 10/07/2018 10/02/2017 09/03/2017  Nervous, Anxious, on Edge 0 0 0  Control/stop worrying 0 0 0  Worry too much - different things 0 0 0  Trouble relaxing 0 0 0  Restless 0 0 0  Easily annoyed or irritable 0 0 0  Afraid - awful might happen 0 0 0  Total GAD 7 Score 0 0 0  Anxiety Difficulty Not difficult at all Not difficult at all Not difficult at all     Past Medical History:  Diagnosis Date  . Allergy   . Anxiety   . Colon polyps    Past Surgical History:  Procedure Laterality Date  . GALLBLADDER SURGERY  1974  . TONSILLECTOMY Bilateral 1960   Social History   Socioeconomic History  . Marital status: Married    Spouse name: Not on file  . Number of children: Not on file  . Years of education: [employer]  . Highest education level: High school graduate  Occupational History  . Not on file  Social Needs  . Financial resource strain: Not on file  . Food insecurity    Worry: Not on file    Inability: Not on file  . Transportation needs    Medical: Not on file    Non-medical: Not on file   Tobacco Use  . Smoking status: Current Every Day Smoker    Packs/day: 1.00    Years: 50.00    Pack years: 50.00  . Smokeless tobacco: Current User  Substance and Sexual Activity  . Alcohol use: Yes  . Drug use: Never  . Sexual activity: Not on file  Lifestyle  . Physical activity    Days per week: Not on file    Minutes per session: Not on file  . Stress: Not on file  Relationships  . Social Herbalist on phone: Not on file    Gets together: Not on file    Attends religious service: Not on file    Active member of club or organization: Not on file    Attends meetings of clubs or organizations: Not on file    Relationship status: Not on file  . Intimate partner violence    Fear of current or ex partner: Not on file    Emotionally abused: Not on file    Physically abused: Not on file    Forced sexual activity: Not on file  Other Topics Concern  . Not on file  Social History Narrative  . Not on file   Family History  Problem Relation Age of Onset  . Leukemia Mother   . Dementia Father    No current outpatient medications on file prior to visit.   No current facility-administered medications on file prior to visit.     Review of Systems  Constitutional: Negative for activity change, appetite change, chills, diaphoresis, fatigue and fever.  HENT: Negative for congestion and hearing loss.   Eyes: Negative for visual disturbance.  Respiratory: Negative for apnea, cough, chest tightness, shortness of breath and wheezing.   Cardiovascular: Negative for chest pain, palpitations and leg swelling.  Gastrointestinal: Negative for abdominal pain, anal bleeding, blood in stool, constipation, diarrhea, nausea and vomiting.       Hemorrhoids  Endocrine: Negative for cold intolerance.  Genitourinary: Negative for difficulty urinating, dysuria, frequency and hematuria.  Musculoskeletal: Negative for arthralgias, back pain and neck pain.  Skin: Negative for rash.   Allergic/Immunologic: Negative for environmental allergies.  Neurological: Negative for dizziness, weakness, light-headedness, numbness and headaches.  Hematological: Negative for adenopathy.  Psychiatric/Behavioral: Negative for behavioral problems, dysphoric mood and sleep disturbance. The patient is not nervous/anxious.    Per HPI unless specifically indicated above      Objective:    BP (!) 125/53   Pulse 77   Temp 98.4 F (36.9 C) (Oral)   Resp 16   Ht 5\' 3"  (1.6 m)   Wt 155 lb (70.3 kg)   BMI 27.46 kg/m   Wt Readings from Last 3 Encounters:  10/07/18 155 lb (70.3 kg)  10/02/17 149 lb (67.6 kg)  09/03/17 147 lb 9.6 oz (67 kg)  Physical Exam Vitals signs and nursing note reviewed.  Constitutional:      General: She is not in acute distress.    Appearance: She is well-developed. She is not diaphoretic.     Comments: Well-appearing, comfortable, cooperative  HENT:     Head: Normocephalic and atraumatic.  Eyes:     General:        Right eye: No discharge.        Left eye: No discharge.     Conjunctiva/sclera: Conjunctivae normal.     Pupils: Pupils are equal, round, and reactive to light.  Neck:     Musculoskeletal: Normal range of motion and neck supple.     Thyroid: No thyromegaly.  Cardiovascular:     Rate and Rhythm: Normal rate and regular rhythm.     Heart sounds: Normal heart sounds. No murmur.  Pulmonary:     Effort: Pulmonary effort is normal. No respiratory distress.     Breath sounds: Normal breath sounds. No wheezing or rales.  Abdominal:     General: Bowel sounds are normal. There is no distension.     Palpations: Abdomen is soft. There is no mass.     Tenderness: There is no abdominal tenderness.  Musculoskeletal: Normal range of motion.        General: No tenderness.     Right lower leg: No edema (trace ankles only).     Comments: Upper / Lower Extremities: - Normal muscle tone, strength bilateral upper extremities 5/5, lower extremities 5/5   Lymphadenopathy:     Cervical: No cervical adenopathy.  Skin:    General: Skin is warm and dry.     Findings: No erythema or rash.     Comments: Evidnece of mild spider and varicose veins lower ext   Neurological:     Mental Status: She is alert and oriented to person, place, and time.     Comments: Distal sensation intact to light touch all extremities  Psychiatric:        Behavior: Behavior normal.     Comments: Well groomed, good eye contact, normal speech and thoughts       Results for orders placed or performed in visit on 09/30/18  Lipid panel  Result Value Ref Range   Cholesterol 214 (H) <200 mg/dL   HDL 68 > OR = 50 mg/dL   Triglycerides 112 <150 mg/dL   LDL Cholesterol (Calc) 124 (H) mg/dL (calc)   Total CHOL/HDL Ratio 3.1 <5.0 (calc)   Non-HDL Cholesterol (Calc) 146 (H) <130 mg/dL (calc)  COMPLETE METABOLIC PANEL WITH GFR  Result Value Ref Range   Glucose, Bld 94 65 - 99 mg/dL   BUN 10 7 - 25 mg/dL   Creat 0.74 0.50 - 0.99 mg/dL   GFR, Est Non African American 83 > OR = 60 mL/min/1.62m2   GFR, Est African American 96 > OR = 60 mL/min/1.44m2   BUN/Creatinine Ratio NOT APPLICABLE 6 - 22 (calc)   Sodium 140 135 - 146 mmol/L   Potassium 4.3 3.5 - 5.3 mmol/L   Chloride 105 98 - 110 mmol/L   CO2 26 20 - 32 mmol/L   Calcium 9.4 8.6 - 10.4 mg/dL   Total Protein 7.0 6.1 - 8.1 g/dL   Albumin 4.5 3.6 - 5.1 g/dL   Globulin 2.5 1.9 - 3.7 g/dL (calc)   AG Ratio 1.8 1.0 - 2.5 (calc)   Total Bilirubin 0.4 0.2 - 1.2 mg/dL   Alkaline phosphatase (APISO) 72 37 - 153 U/L  AST 13 10 - 35 U/L   ALT 11 6 - 29 U/L  CBC with Differential/Platelet  Result Value Ref Range   WBC 8.6 3.8 - 10.8 Thousand/uL   RBC 4.56 3.80 - 5.10 Million/uL   Hemoglobin 14.7 11.7 - 15.5 g/dL   HCT 45.7 (H) 35.0 - 45.0 %   MCV 100.2 (H) 80.0 - 100.0 fL   MCH 32.2 27.0 - 33.0 pg   MCHC 32.2 32.0 - 36.0 g/dL   RDW 12.8 11.0 - 15.0 %   Platelets 289 140 - 400 Thousand/uL   MPV 10.4 7.5 - 12.5 fL    Neutro Abs 4,162 1,500 - 7,800 cells/uL   Lymphs Abs 3,638 850 - 3,900 cells/uL   Absolute Monocytes 559 200 - 950 cells/uL   Eosinophils Absolute 181 15 - 500 cells/uL   Basophils Absolute 60 0 - 200 cells/uL   Neutrophils Relative % 48.4 %   Total Lymphocyte 42.3 %   Monocytes Relative 6.5 %   Eosinophils Relative 2.1 %   Basophils Relative 0.7 %  Hemoglobin A1c  Result Value Ref Range   Hgb A1c MFr Bld 5.5 <5.7 % of total Hgb   Mean Plasma Glucose 111 (calc)   eAG (mmol/L) 6.2 (calc)      Assessment & Plan:   Problem List Items Addressed This Visit    Hyperlipidemia    Mildly Uncontrolled cholesterol elevated LDL but good HDL, improving lifestyle Last lipid panel 09/2018 Calculated ASCVD 10 yr risk score 13.2% (due to smoking, otherwise down to 7.8%)  Plan: 1. Counseling on ASCVD risk reduction with statin - she declines at this time, again - discussed in detail, she would only need low dose statin but she defers. 2. Encourage improved lifestyle - low carb/cholesterol, reduce portion size, continue improving regular exercise      Major depression, recurrent, full remission ([place])    Stable well controlled on current therapy Insomnia seems secondary to mood/anxiety - also controlled PHQ and GAD score 0 Not followed by Psych or Therapist  Plan Continue Trazodone 100mg  nightly monotherapy      Relevant Medications   traZODone (DESYREL) 100 MG tablet   Tobacco abuse    Counseling on risks of smoking Not interested in quitting No prior successful quit attempts Recommend future Low Dose CT Lung CA Screening - she is not interested at this time       Other Visit Diagnoses    Annual physical exam    -  Primary   Need for 23-polyvalent pneumococcal polysaccharide vaccine       Relevant Orders   Pneumococcal polysaccharide vaccine 23-valent greater than or equal to 2yo subcutaneous/IM (Completed)      Updated Health Maintenance information - Offered multiple screening  tests Mammogram, Colonoscopy vs Cologuard, DEXA, CT Lung declines all at this time, not interested to go to hospital / testing facility due to coronavirus risk, in past she has also declined will reconsider in future. - Declines tobacco cessation Reviewed recent lab results with patient Encouraged improvement to lifestyle with diet and exercise - Goal of weight loss    Meds ordered this encounter  Medications  . traZODone (DESYREL) 100 MG tablet    Sig: TAKE 1 TABLET BY MOUTH EVERY DAY AT NIGHT    Dispense:  90 tablet    Refill:  3    Follow up plan: Return in about 1 year (around 10/07/2019) for Annual Physical.  Future labs ordered.  Nobie Putnam, DO [facility] 10/07/2018, 2:19 PM

## 2018-10-07 NOTE — Assessment & Plan Note (Signed)
Mildly Uncontrolled cholesterol elevated LDL but good HDL, improving lifestyle Last lipid panel 09/2018 Calculated ASCVD 10 yr risk score 13.2% (due to smoking, otherwise down to 7.8%)  Plan: 1. Counseling on ASCVD risk reduction with statin - she declines at this time, again - discussed in detail, she would only need low dose statin but she defers. 2. Encourage improved lifestyle - low carb/cholesterol, reduce portion size, continue improving regular exercise

## 2018-10-07 NOTE — Assessment & Plan Note (Signed)
Stable well controlled on current therapy Insomnia seems secondary to mood/anxiety - also controlled PHQ and GAD score 0 Not followed by Psych or Therapist  Plan Continue Trazodone 100mg nightly monotherapy 

## 2018-10-07 NOTE — Assessment & Plan Note (Addendum)
Counseling on risks of smoking Not interested in quitting No prior successful quit attempts Recommend future Low Dose CT Lung CA Screening - she is not interested at this time 

## 2019-08-27 ENCOUNTER — Other Ambulatory Visit: Payer: Self-pay | Admitting: Internal Medicine

## 2019-08-27 ENCOUNTER — Other Ambulatory Visit: Payer: Self-pay | Admitting: Family Medicine

## 2019-08-27 DIAGNOSIS — Z1231 Encounter for screening mammogram for malignant neoplasm of breast: Secondary | ICD-10-CM

## 2019-09-01 ENCOUNTER — Ambulatory Visit
Admission: RE | Admit: 2019-09-01 | Discharge: 2019-09-01 | Disposition: A | Discharge status: Home / Self Care | Payer: Medicare Other | Source: Ambulatory Visit | Attending: Family Medicine | Admitting: Family Medicine

## 2019-09-01 DIAGNOSIS — Z1231 Encounter for screening mammogram for malignant neoplasm of breast: Secondary | ICD-10-CM | POA: Diagnosis not present

## 2019-09-07 ENCOUNTER — Telehealth: Payer: Self-pay | Admitting: Family Medicine

## 2019-09-07 NOTE — Telephone Encounter (Signed)
Patient was scheduled for labs one week prior to CPE on 10/01/19. Can orders for labs be placed?

## 2019-09-22 DIAGNOSIS — H2513 Age-related nuclear cataract, bilateral: Secondary | ICD-10-CM | POA: Diagnosis not present

## 2019-10-01 ENCOUNTER — Other Ambulatory Visit: Payer: Medicare Other

## 2019-10-01 ENCOUNTER — Other Ambulatory Visit: Payer: Self-pay

## 2019-10-01 DIAGNOSIS — Z0000 Encounter for general adult medical examination without abnormal findings: Secondary | ICD-10-CM

## 2019-10-01 DIAGNOSIS — R7989 Other specified abnormal findings of blood chemistry: Secondary | ICD-10-CM | POA: Diagnosis not present

## 2019-10-01 DIAGNOSIS — R718 Other abnormality of red blood cells: Secondary | ICD-10-CM | POA: Diagnosis not present

## 2019-10-01 DIAGNOSIS — E785 Hyperlipidemia, unspecified: Secondary | ICD-10-CM | POA: Diagnosis not present

## 2019-10-01 DIAGNOSIS — R7309 Other abnormal glucose: Secondary | ICD-10-CM | POA: Diagnosis not present

## 2019-10-02 LAB — COMPLETE METABOLIC PANEL WITHOUT GFR
AG Ratio: 1.8 (calc) (ref 1.0–2.5)
ALT: 9 U/L (ref 6–29)
AST: 11 U/L (ref 10–35)
Albumin: 4.2 g/dL (ref 3.6–5.1)
Alkaline phosphatase (APISO): 66 U/L (ref 37–153)
BUN: 9 mg/dL (ref 7–25)
CO2: 28 mmol/L (ref 20–32)
Calcium: 9.3 mg/dL (ref 8.6–10.4)
Chloride: 104 mmol/L (ref 98–110)
Creat: 0.76 mg/dL (ref 0.60–0.93)
GFR, Est African American: 92 mL/min/{1.73_m2}
GFR, Est Non African American: 79 mL/min/{1.73_m2}
Globulin: 2.4 g/dL (ref 1.9–3.7)
Glucose, Bld: 92 mg/dL (ref 65–99)
Potassium: 4.5 mmol/L (ref 3.5–5.3)
Sodium: 141 mmol/L (ref 135–146)
Total Bilirubin: 0.4 mg/dL (ref 0.2–1.2)
Total Protein: 6.6 g/dL (ref 6.1–8.1)

## 2019-10-02 LAB — CBC WITH DIFFERENTIAL/PLATELET
Absolute Monocytes: 498 {cells}/uL (ref 200–950)
Basophils Absolute: 58 {cells}/uL (ref 0–200)
Basophils Relative: 0.7 %
Eosinophils Absolute: 199 {cells}/uL (ref 15–500)
Eosinophils Relative: 2.4 %
HCT: 44.5 % (ref 35.0–45.0)
Hemoglobin: 14.3 g/dL (ref 11.7–15.5)
Lymphs Abs: 3013 {cells}/uL (ref 850–3900)
MCH: 32.6 pg (ref 27.0–33.0)
MCHC: 32.1 g/dL (ref 32.0–36.0)
MCV: 101.4 fL — ABNORMAL HIGH (ref 80.0–100.0)
MPV: 10.6 fL (ref 7.5–12.5)
Monocytes Relative: 6 %
Neutro Abs: 4532 {cells}/uL (ref 1500–7800)
Neutrophils Relative %: 54.6 %
Platelets: 284 10*3/uL (ref 140–400)
RBC: 4.39 Million/uL (ref 3.80–5.10)
RDW: 12.5 % (ref 11.0–15.0)
Total Lymphocyte: 36.3 %
WBC: 8.3 10*3/uL (ref 3.8–10.8)

## 2019-10-02 LAB — LIPID PANEL
Cholesterol: 208 mg/dL — ABNORMAL HIGH
HDL: 64 mg/dL
LDL Cholesterol (Calc): 122 mg/dL — ABNORMAL HIGH
Non-HDL Cholesterol (Calc): 144 mg/dL — ABNORMAL HIGH
Total CHOL/HDL Ratio: 3.3 (calc)
Triglycerides: 117 mg/dL

## 2019-10-02 LAB — HEMOGLOBIN A1C
Hgb A1c MFr Bld: 5.4 %{Hb}
Mean Plasma Glucose: 108 (calc)
eAG (mmol/L): 6 (calc)

## 2019-10-08 ENCOUNTER — Other Ambulatory Visit: Payer: Self-pay | Admitting: Family Medicine

## 2019-10-08 ENCOUNTER — Ambulatory Visit (INDEPENDENT_AMBULATORY_CARE_PROVIDER_SITE_OTHER): Payer: Medicare Other | Admitting: Family Medicine

## 2019-10-08 ENCOUNTER — Encounter: Payer: Self-pay | Admitting: Family Medicine

## 2019-10-08 ENCOUNTER — Other Ambulatory Visit: Payer: Self-pay

## 2019-10-08 VITALS — BP 121/45 | HR 88 | Temp 98.2°F | Resp 16 | Ht 63.0 in | Wt 149.6 lb

## 2019-10-08 DIAGNOSIS — Z720 Tobacco use: Secondary | ICD-10-CM | POA: Diagnosis not present

## 2019-10-08 DIAGNOSIS — Z0000 Encounter for general adult medical examination without abnormal findings: Secondary | ICD-10-CM

## 2019-10-08 DIAGNOSIS — R7309 Other abnormal glucose: Secondary | ICD-10-CM

## 2019-10-08 DIAGNOSIS — E7800 Pure hypercholesterolemia, unspecified: Secondary | ICD-10-CM

## 2019-10-08 DIAGNOSIS — F3342 Major depressive disorder, recurrent, in full remission: Secondary | ICD-10-CM

## 2019-10-08 NOTE — Assessment & Plan Note (Signed)
Stable well controlled on current therapy Insomnia seems secondary to mood/anxiety - also controlled PHQ and GAD score 0 Not followed by Psych or Therapist  Plan Continue Trazodone 100mg  nightly monotherapy

## 2019-10-08 NOTE — Assessment & Plan Note (Signed)
Counseling on risks of smoking Not interested in quitting No prior successful quit attempts Recommend future Low Dose CT Lung CA Screening - she is not interested at this time

## 2019-10-08 NOTE — Assessment & Plan Note (Signed)
Mildly Uncontrolled cholesterol elevated LDL but good HDL, improving lifestyle still Last lipid panel 09/2019 Calculated ASCVD 10 yr risk score >13% (due to smoking, otherwise down to 7-8%)  Plan: 1. Counseling on ASCVD risk reduction with statin - she declines again 2. Encourage improved lifestyle - low carb/cholesterol, reduce portion size, continue improving regular exercise

## 2019-10-08 NOTE — Patient Instructions (Addendum)
Thank you for coming to the office today.  \1. Chemistry - Normal results, including electrolytes, kidney and liver function. Normal fasting blood sugar   2. Hemoglobin A1c (Diabetes screening) - 5.4, normal not in range of Pre-Diabetes (>5.7 to 6.4)   3. Cholesterol - Mild Abnormal cholesterol results. normal HDL (good cholesterol), elevated 122 similar to prior 124-129 LDL (bad cholesterol), and normal Triglycerides. Consider initiating Statin therapy.  5. CBC Blood Counts - Normal except elevated MCV 101, no anemia, other abnormality  Refilled Trazodone 90 day for 1 year  Leg cramps - Try spoonful of yellow mustard to relieve leg cramps or try daily to prevent the problem  - OTC natural option is Hyland's Leg Cramps (Dissolving tablet) take as needed for muscle cramps  Varicose veins and minor swelling of legs is normal.   DUE for FASTING BLOOD WORK (no food or drink after midnight before the lab appointment, only water or coffee without cream/sugar on the morning of)  SCHEDULE "Lab Only" visit in the morning at the clinic for lab draw in 1 YEAR  - Make sure Lab Only appointment is at about 1 week before your next appointment, so that results will be available  For Lab Results, once available within 2-3 days of blood draw, you can can log in to MyChart online to view your results and a brief explanation. Also, we can discuss results at next follow-up visit.   Please schedule a Follow-up Appointment to: Return in about 1 year (around 10/07/2020) for Annual Physical.  If you have any other questions or concerns, please feel free to call the office or send a message through [place]. You may also schedule an earlier appointment if necessary.  Additionally, you may be receiving a survey about your experience at our office within a few days to 1 week by e-mail or mail. We value your feedback.  Nobie Putnam, DO [place]

## 2019-10-08 NOTE — Progress Notes (Signed)
Subjective:    Patient ID: Brittany Weeks, female    DOB: 1950-04-05, 69 y.o.   MRN: [id number]  Brittany Weeks is a 69 y.o. 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 Doing well on Trazodone 100mg  nightly - Not established with Psychiatry or Counselor / Therapist - not interested  HYPERLIPIDEMIA: Last lipid 09/2019, elevated LDL mild but improved, otherwise normal HDL Declines statin therapy again Admits improved healthy diet, except does eat dessert often  Tobacco Abuse Chronic history smoker 1ppd >50 years. Not interested in quitting. Has not had lung CA screening with CT, not interested at this time.   Health Maintenance:  Cervical Cancer Screening - no longer due, s/p last pap at age 27 negative, declines further screening  Not updated on COVID19 vaccine.  Colonoscopy last 2013 - had some hyperplasticpolyps by path report - no colonoscopy report available, previously performed by Dr Gustavo Lah at [place] was delayed in 2020 due to covid - now scheduled for August 2021 with [facility] Dr Alice Reichert  Breast CA Screening: Due for mammogram screening. [facility] Known family history with 4 year old daughter with breast cancer. Currently asymptomatic.  LastDEXA 2014, she was previously treated with Fosamax back in 2004.Now declines further treatment or testing.   Depression screen [facility] 2/9 10/08/2019 10/07/2018 10/02/2017  Decreased Interest 0 0 0  Down, Depressed, Hopeless 0 0 0  PHQ - 2 Score 0 0 0  Altered sleeping 0 0 0  Tired, decreased  energy 0 0 0  Change in appetite 0 0 0  Feeling bad or failure about yourself  0 0 0  Trouble concentrating 0 0 0  Moving slowly or fidgety/restless 0 0 0  Suicidal thoughts 0 0 0  PHQ-9 Score 0 0 0  Difficult doing work/chores Not difficult at all Not difficult at all Not difficult at all    Past Medical History:  Diagnosis Date  . Allergy   . Anxiety   . Colon polyps    Past Surgical History:  Procedure Laterality Date  . GALLBLADDER SURGERY  1974  . TONSILLECTOMY Bilateral 1960   Social History   Socioeconomic History  . Marital status: Married    Spouse name: Not on file  . Number of children: Not on file  . Years of education: [employer]  . Highest education level: High school graduate  Occupational History  . Not on file  Tobacco Use  . Smoking status: Current Every Day Smoker    Packs/day: 1.00    Years: 50.00    Pack years: 50.00  . Smokeless tobacco: Current User  Vaping Use  . Vaping Use: Never used  Substance and Sexual Activity  . Alcohol use: Yes  . Drug use: Never  . Sexual activity: Not on file  Other Topics Concern  . Not on file  Social History Narrative  . Not on file   Social Determinants of Health   Financial Resource Strain:   . Difficulty of Paying Living Expenses:   Food Insecurity:   . Worried About Charity fundraiser in the Last Year:   .  Ran Out of Food in the Last Year:   Transportation Needs:   . Film/video editor (Medical):   Marland Kitchen Lack of Transportation (Non-Medical):   Physical Activity:   . Days of Exercise per Week:   . Minutes of Exercise per Session:   Stress:   . Feeling of Stress :   Social Connections:   . Frequency of Communication with Friends and Family:   . Frequency of Social Gatherings with Friends and Family:   . Attends Religious Services:   . Active Member of Clubs or Organizations:   . Attends Archivist Meetings:   Marland Kitchen Marital Status:   Intimate Partner Violence:   . Fear of Current or  Ex-Partner:   . Emotionally Abused:   Marland Kitchen Physically Abused:   . Sexually Abused:    Family History  Problem Relation Age of Onset  . Leukemia Mother   . Dementia Father   . Breast cancer Daughter    Current Outpatient Medications on File Prior to Visit  Medication Sig  . traZODone (DESYREL) 100 MG tablet TAKE 1 TABLET BY MOUTH EVERY DAY AT NIGHT   No current facility-administered medications on file prior to visit.    Review of Systems  Constitutional: Negative for activity change, appetite change, chills, diaphoresis, fatigue and fever.  HENT: Negative for congestion and hearing loss.   Eyes: Negative for visual disturbance.  Respiratory: Negative for apnea, cough, chest tightness, shortness of breath and wheezing.   Cardiovascular: Negative for chest pain, palpitations and leg swelling.  Gastrointestinal: Negative for abdominal pain, anal bleeding, blood in stool, constipation, diarrhea, nausea and vomiting.  Endocrine: Negative for cold intolerance.  Genitourinary: Negative for difficulty urinating, dysuria, frequency and hematuria.  Musculoskeletal: Negative for arthralgias, back pain and neck pain.  Skin: Negative for rash.  Allergic/Immunologic: Negative for environmental allergies.  Neurological: Negative for dizziness, weakness, light-headedness, numbness and headaches.  Hematological: Negative for adenopathy.  Psychiatric/Behavioral: Negative for behavioral problems, dysphoric mood and sleep disturbance. The patient is not nervous/anxious.    Per HPI unless specifically indicated above     Objective:    BP (!) 121/45   Pulse 88   Temp 98.2 F (36.8 C) (Temporal)   Resp 16   Ht 5\' 3"  (1.6 m)   Wt 149 lb 9.6 oz (67.9 kg)   SpO2 97%   BMI 26.50 kg/m   Wt Readings from Last 3 Encounters:  10/08/19 149 lb 9.6 oz (67.9 kg)  10/07/18 155 lb (70.3 kg)  10/02/17 149 lb (67.6 kg)    Physical Exam Vitals and nursing note reviewed.  Constitutional:      General:  She is not in acute distress.    Appearance: She is well-developed. She is not diaphoretic.     Comments: Well-appearing, comfortable, cooperative  HENT:     Head: Normocephalic and atraumatic.  Eyes:     General:        Right eye: No discharge.        Left eye: No discharge.     Conjunctiva/sclera: Conjunctivae normal.     Pupils: Pupils are equal, round, and reactive to light.  Neck:     Thyroid: No thyromegaly.  Cardiovascular:     Rate and Rhythm: Normal rate and regular rhythm.     Heart sounds: Normal heart sounds. No murmur heard.   Pulmonary:     Effort: Pulmonary effort is normal. No respiratory distress.     Breath sounds: Normal breath sounds. No wheezing  or rales.  Abdominal:     General: Bowel sounds are normal. There is no distension.     Palpations: Abdomen is soft. There is no mass.     Tenderness: There is no abdominal tenderness.  Musculoskeletal:        General: No tenderness. Normal range of motion.     Cervical back: Normal range of motion and neck supple.     Right lower leg: No edema (trace ankles only).     Comments: Upper / Lower Extremities: - Normal muscle tone, strength bilateral upper extremities 5/5, lower extremities 5/5  Lymphadenopathy:     Cervical: No cervical adenopathy.  Skin:    General: Skin is warm and dry.     Findings: No erythema or rash.     Comments: Evidnece of mild spider and varicose veins lower ext   Neurological:     Mental Status: She is alert and oriented to person, place, and time.     Comments: Distal sensation intact to light touch all extremities  Psychiatric:        Behavior: Behavior normal.     Comments: Well groomed, good eye contact, normal speech and thoughts      CLINICAL DATA:  Screening.  EXAM: DIGITAL SCREENING BILATERAL MAMMOGRAM WITH TOMO AND CAD  COMPARISON:  Previous exam(s).  ACR Breast Density Category b: There are scattered areas of fibroglandular density.  FINDINGS: There are no  findings suspicious for malignancy. Images were processed with CAD.  IMPRESSION: No mammographic evidence of malignancy. A result letter of this screening mammogram will be mailed directly to the patient.  RECOMMENDATION: Screening mammogram in one year. (Code:SM-B-01Y)  BI-RADS CATEGORY  1: Negative.   Electronically Signed   By: Ammie Ferrier M.D.   On: 09/03/2019 09:48  Results for orders placed or performed in visit on 10/01/19  Lipid panel  Result Value Ref Range   Cholesterol 208 (H) <200 mg/dL   HDL 64 > OR = 50 mg/dL   Triglycerides 117 <150 mg/dL   LDL Cholesterol (Calc) 122 (H) mg/dL (calc)   Total CHOL/HDL Ratio 3.3 <5.0 (calc)   Non-HDL Cholesterol (Calc) 144 (H) <130 mg/dL (calc)  COMPLETE METABOLIC PANEL WITH GFR  Result Value Ref Range   Glucose, Bld 92 65 - 99 mg/dL   BUN 9 7 - 25 mg/dL   Creat 0.76 0.60 - 0.93 mg/dL   GFR, Est Non African American 79 > OR = 60 mL/min/1.9m2   GFR, Est African American 92 > OR = 60 mL/min/1.68m2   BUN/Creatinine Ratio NOT APPLICABLE 6 - 22 (calc)   Sodium 141 135 - 146 mmol/L   Potassium 4.5 3.5 - 5.3 mmol/L   Chloride 104 98 - 110 mmol/L   CO2 28 20 - 32 mmol/L   Calcium 9.3 8.6 - 10.4 mg/dL   Total Protein 6.6 6.1 - 8.1 g/dL   Albumin 4.2 3.6 - 5.1 g/dL   Globulin 2.4 1.9 - 3.7 g/dL (calc)   AG Ratio 1.8 1.0 - 2.5 (calc)   Total Bilirubin 0.4 0.2 - 1.2 mg/dL   Alkaline phosphatase (APISO) 66 37 - 153 U/L   AST 11 10 - 35 U/L   ALT 9 6 - 29 U/L  CBC with Differential/Platelet  Result Value Ref Range   WBC 8.3 3.8 - 10.8 Thousand/uL   RBC 4.39 3.80 - 5.10 Million/uL   Hemoglobin 14.3 11.7 - 15.5 g/dL   HCT 44.5 35 - 45 %   MCV 101.4 (  H) 80.0 - 100.0 fL   MCH 32.6 27.0 - 33.0 pg   MCHC 32.1 32.0 - 36.0 g/dL   RDW 12.5 11.0 - 15.0 %   Platelets 284 140 - 400 Thousand/uL   MPV 10.6 7.5 - 12.5 fL   Neutro Abs 4,532 1,500 - 7,800 cells/uL   Lymphs Abs 3,013 850 - 3,900 cells/uL   Absolute Monocytes 498  200 - 950 cells/uL   Eosinophils Absolute 199 15 - 500 cells/uL   Basophils Absolute 58 0 - 200 cells/uL   Neutrophils Relative % 54.6 %   Total Lymphocyte 36.3 %   Monocytes Relative 6.0 %   Eosinophils Relative 2.4 %   Basophils Relative 0.7 %  Hemoglobin A1c  Result Value Ref Range   Hgb A1c MFr Bld 5.4 <5.7 % of total Hgb   Mean Plasma Glucose 108 (calc)   eAG (mmol/L) 6.0 (calc)      Assessment & Plan:   Problem List Items Addressed This Visit    Tobacco abuse    Counseling on risks of smoking Not interested in quitting No prior successful quit attempts Recommend future Low Dose CT Lung CA Screening - she is not interested at this time      Major depression, recurrent, full remission ([place])    Stable well controlled on current therapy Insomnia seems secondary to mood/anxiety - also controlled PHQ and GAD score 0 Not followed by Psych or Therapist  Plan Continue Trazodone 100mg  nightly monotherapy      Hyperlipidemia    Mildly Uncontrolled cholesterol elevated LDL but good HDL, improving lifestyle still Last lipid panel 09/2019 Calculated ASCVD 10 yr risk score >13% (due to smoking, otherwise down to 7-8%)  Plan: 1. Counseling on ASCVD risk reduction with statin - she declines again 2. Encourage improved lifestyle - low carb/cholesterol, reduce portion size, continue improving regular exercise       Other Visit Diagnoses    Annual physical exam    -  Primary      Updated Health Maintenance information - Mammogram UTD - Scheduled upcoming Colonoscopy Smoking cessation offered. Reviewed recent lab results with patient Encouraged improvement to lifestyle with diet and exercise Maintain healthy weight.  No orders of the defined types were placed in this encounter.    Follow up plan: Return in about 1 year (around 10/07/2020) for Annual Physical.  Future labs ordered for 10/05/20  Nobie Putnam, [facility] 10/08/2019, 11:01 AM

## 2019-10-09 ENCOUNTER — Encounter: Payer: Self-pay | Admitting: Family Medicine

## 2019-10-09 DIAGNOSIS — R718 Other abnormality of red blood cells: Secondary | ICD-10-CM | POA: Insufficient documentation

## 2019-10-21 DIAGNOSIS — K529 Noninfective gastroenteritis and colitis, unspecified: Secondary | ICD-10-CM | POA: Diagnosis not present

## 2019-10-21 DIAGNOSIS — Z86010 Personal history of colonic polyps: Secondary | ICD-10-CM | POA: Diagnosis not present

## 2019-10-22 DIAGNOSIS — K529 Noninfective gastroenteritis and colitis, unspecified: Secondary | ICD-10-CM | POA: Diagnosis not present

## 2019-11-04 ENCOUNTER — Other Ambulatory Visit
Admission: RE | Admit: 2019-11-04 | Discharge: 2019-11-04 | Disposition: A | Discharge status: Home / Self Care | Payer: Medicare Other | Source: Ambulatory Visit | Attending: Gastroenterology | Admitting: Gastroenterology

## 2019-11-04 ENCOUNTER — Other Ambulatory Visit: Payer: Self-pay

## 2019-11-04 DIAGNOSIS — Z20822 Contact with and (suspected) exposure to covid-19: Secondary | ICD-10-CM | POA: Diagnosis not present

## 2019-11-04 DIAGNOSIS — Z01812 Encounter for preprocedural laboratory examination: Secondary | ICD-10-CM | POA: Diagnosis not present

## 2019-11-04 LAB — SARS CORONAVIRUS 2 (TAT 6-24 HRS): SARS Coronavirus 2: NEGATIVE

## 2019-11-05 ENCOUNTER — Encounter: Payer: Self-pay | Admitting: *Deleted

## 2019-11-06 ENCOUNTER — Other Ambulatory Visit: Payer: Self-pay

## 2019-11-06 ENCOUNTER — Encounter: Payer: Self-pay | Admitting: *Deleted

## 2019-11-06 ENCOUNTER — Ambulatory Visit
Admission: RE | Admit: 2019-11-06 | Discharge: 2019-11-06 | Disposition: A | Discharge status: Home / Self Care | Payer: Medicare Other | Attending: Gastroenterology | Admitting: Gastroenterology

## 2019-11-06 ENCOUNTER — Encounter
Admission: RE | Disposition: A | Discharge status: Home / Self Care | Payer: Self-pay | Source: Home / Self Care | Attending: Gastroenterology

## 2019-11-06 ENCOUNTER — Ambulatory Visit: Payer: Medicare Other | Admitting: Anesthesiology

## 2019-11-06 DIAGNOSIS — K5790 Diverticulosis of intestine, part unspecified, without perforation or abscess without bleeding: Secondary | ICD-10-CM | POA: Diagnosis not present

## 2019-11-06 DIAGNOSIS — Z86010 Personal history of colonic polyps: Secondary | ICD-10-CM | POA: Diagnosis not present

## 2019-11-06 DIAGNOSIS — K644 Residual hemorrhoidal skin tags: Secondary | ICD-10-CM | POA: Insufficient documentation

## 2019-11-06 DIAGNOSIS — K5730 Diverticulosis of large intestine without perforation or abscess without bleeding: Secondary | ICD-10-CM | POA: Diagnosis not present

## 2019-11-06 DIAGNOSIS — Z9049 Acquired absence of other specified parts of digestive tract: Secondary | ICD-10-CM | POA: Diagnosis not present

## 2019-11-06 DIAGNOSIS — D128 Benign neoplasm of rectum: Secondary | ICD-10-CM | POA: Diagnosis not present

## 2019-11-06 DIAGNOSIS — K641 Second degree hemorrhoids: Secondary | ICD-10-CM | POA: Diagnosis not present

## 2019-11-06 DIAGNOSIS — Z79899 Other long term (current) drug therapy: Secondary | ICD-10-CM | POA: Insufficient documentation

## 2019-11-06 DIAGNOSIS — K635 Polyp of colon: Secondary | ICD-10-CM | POA: Diagnosis not present

## 2019-11-06 DIAGNOSIS — F329 Major depressive disorder, single episode, unspecified: Secondary | ICD-10-CM | POA: Diagnosis not present

## 2019-11-06 DIAGNOSIS — F419 Anxiety disorder, unspecified: Secondary | ICD-10-CM | POA: Insufficient documentation

## 2019-11-06 DIAGNOSIS — K529 Noninfective gastroenteritis and colitis, unspecified: Secondary | ICD-10-CM | POA: Diagnosis not present

## 2019-11-06 DIAGNOSIS — K621 Rectal polyp: Secondary | ICD-10-CM | POA: Diagnosis not present

## 2019-11-06 DIAGNOSIS — R197 Diarrhea, unspecified: Secondary | ICD-10-CM | POA: Diagnosis not present

## 2019-11-06 HISTORY — DX: Depression, unspecified: F32.A

## 2019-11-06 HISTORY — PX: COLONOSCOPY WITH PROPOFOL: SHX5780

## 2019-11-06 SURGERY — COLONOSCOPY WITH PROPOFOL
Anesthesia: General

## 2019-11-06 MED ORDER — PHENYLEPHRINE HCL (PRESSORS) 10 MG/ML IV SOLN
INTRAVENOUS | Status: DC | PRN
  Administered 2019-11-06: 100 ug via INTRAVENOUS

## 2019-11-06 MED ORDER — PROPOFOL 500 MG/50ML IV EMUL
INTRAVENOUS | Status: DC | PRN
  Administered 2019-11-06: 175 ug/kg/min via INTRAVENOUS

## 2019-11-06 MED ORDER — PROPOFOL 500 MG/50ML IV EMUL
INTRAVENOUS | Status: AC
  Filled 2019-11-06: qty 50

## 2019-11-06 MED ORDER — SODIUM CHLORIDE 0.9 % IV SOLN: INTRAVENOUS | Status: DC

## 2019-11-06 MED ORDER — PROPOFOL 10 MG/ML IV BOLUS
INTRAVENOUS | Status: DC | PRN
  Administered 2019-11-06: 50 mg via INTRAVENOUS
  Administered 2019-11-06: 20 mg via INTRAVENOUS

## 2019-11-06 NOTE — Anesthesia Preprocedure Evaluation (Addendum)
Anesthesia Evaluation  Patient identified by MRN, date of birth, ID band Patient awake    Reviewed: Allergy & Precautions, H&P , NPO status , Patient's Chart, lab work & pertinent test results, reviewed documented beta blocker date and time   History of Anesthesia Complications Negative for: history of anesthetic complications  Airway Mallampati: II  TM Distance: >3 FB Neck ROM: full    Dental  (+) Dental Advidsory Given, Edentulous Upper, Upper Dentures, Partial Lower   Pulmonary neg shortness of breath, neg COPD, neg recent URI, Current Smoker,    Pulmonary exam normal breath sounds clear to auscultation       Cardiovascular Exercise Tolerance: Good negative cardio ROS Normal cardiovascular exam Rhythm:regular Rate:Normal     Neuro/Psych PSYCHIATRIC DISORDERS Anxiety Depression negative neurological ROS     GI/Hepatic negative GI ROS, Neg liver ROS,   Endo/Other  negative endocrine ROS  Renal/GU negative Renal ROS  negative genitourinary   Musculoskeletal   Abdominal   Peds  Hematology negative hematology ROS (+)   Anesthesia Other Findings Past Medical History: No date: Allergy No date: Anxiety No date: Colon polyps No date: Depression   Reproductive/Obstetrics negative OB ROS                            Anesthesia Physical Anesthesia Plan  ASA: II  Anesthesia Plan: General   Post-op Pain Management:    Induction: Intravenous  PONV Risk Score and Plan: 2 and Propofol infusion and TIVA  Airway Management Planned: Natural Airway and Nasal Cannula  Additional Equipment:   Intra-op Plan:   Post-operative Plan:   Informed Consent: I have reviewed the patients History and Physical, chart, labs and discussed the procedure including the risks, benefits and alternatives for the proposed anesthesia with the patient or authorized representative who has indicated his/her  understanding and acceptance.     Dental Advisory Given  Plan Discussed with: Anesthesiologist, CRNA and Surgeon  Anesthesia Plan Comments:         Anesthesia Quick Evaluation

## 2019-11-06 NOTE — Transfer of Care (Signed)
Immediate Anesthesia Transfer of Care Note  Patient: Brittany Weeks  Procedure(s) Performed: COLONOSCOPY WITH PROPOFOL (N/A )  Patient Location: PACU  Anesthesia Type:General  Level of Consciousness: awake, alert  and oriented  Airway & Oxygen Therapy: Patient Spontanous Breathing and Patient connected to nasal cannula oxygen  Post-op Assessment: Report given to RN  Post vital signs: Reviewed and stable  Last Vitals:  Vitals Value Taken Time  BP 103/64 11/06/19 0829  Temp    Pulse 76 11/06/19 0830  Resp 14 11/06/19 0830  SpO2 96 % 11/06/19 0830  Vitals shown include unvalidated device data.  Last Pain:  Vitals:   11/06/19 0828  TempSrc:   PainSc: 0-No pain         Complications: No complications documented.

## 2019-11-06 NOTE — Anesthesia Postprocedure Evaluation (Signed)
Anesthesia Post Note  Patient: [name]  Procedure(s) Performed: COLONOSCOPY WITH PROPOFOL (N/A )  Patient location during evaluation: Endoscopy Anesthesia Type: General Level of consciousness: awake and alert Pain management: pain level controlled Vital Signs Assessment: post-procedure vital signs reviewed and stable Respiratory status: spontaneous breathing, nonlabored ventilation, respiratory function stable and patient connected to nasal cannula oxygen Cardiovascular status: blood pressure returned to baseline and stable Postop Assessment: no apparent nausea or vomiting Anesthetic complications: no   No complications documented.   Last Vitals:  Vitals:   11/06/19 0838 11/06/19 0848  BP: (!) 103/54 (!) 117/59  Pulse: 70 71  Resp: 12 16  Temp:    SpO2: 95% 100%    Last Pain:  Vitals:   11/06/19 0848  TempSrc:   PainSc: 0-No pain                 Martha Clan

## 2019-11-06 NOTE — Anesthesia Procedure Notes (Signed)
Date/Time: 11/06/2019 8:01 AM Performed by: Nelda Marseille, CRNA Pre-anesthesia Checklist: Patient identified, Emergency Drugs available, Suction available, Patient being monitored and Timeout performed Oxygen Delivery Method: Nasal cannula

## 2019-11-06 NOTE — Op Note (Addendum)
[facility] Gastroenterology Patient Name: Brittany Weeks Procedure Date: 11/06/2019 7:12 AM MRN: [id number] Account #: [id number] Date of Birth: 10/22/1950 Admit Type: Outpatient Age: 69 Room: [facility] ENDO ROOM 3 Gender: Female Note Status: Supervisor Override Procedure:             Colonoscopy Indications:           Chronic diarrhea Providers:             Andrey Farmer MD, MD Referring MD:          Olin Hauser (Referring MD) Medicines:             Monitored Anesthesia Care Complications:         No immediate complications. Estimated blood loss:                         Minimal. Procedure:             Pre-Anesthesia Assessment:                        - Prior to the procedure, a History and Physical was                         performed, and patient medications and allergies were                         reviewed. The patient is competent. The risks and                         benefits of the procedure and the sedation options and                         risks were discussed with the patient. All questions                         were answered and informed consent was obtained.                         Patient identification and proposed procedure were                         verified by the physician, the nurse, the anesthetist                         and the technician in the endoscopy suite. Mental                         Status Examination: alert and oriented. Airway                         Examination: normal oropharyngeal airway and neck                         mobility. Respiratory Examination: clear to                         auscultation. CV Examination: normal. Prophylactic                         Antibiotics: The  patient does not require prophylactic                         antibiotics. Prior Anticoagulants: The patient has                         taken no previous anticoagulant or antiplatelet                         agents. ASA Grade  Assessment: III - A patient with                         severe systemic disease. After reviewing the risks and                         benefits, the patient was deemed in satisfactory                         condition to undergo the procedure. The anesthesia                         plan was to use monitored anesthesia care (MAC).                         Immediately prior to administration of medications,                         the patient was re-assessed for adequacy to receive                         sedatives. The heart rate, respiratory rate, oxygen                         saturations, blood pressure, adequacy of pulmonary                         ventilation, and response to care were monitored                         throughout the procedure. The physical status of the                         patient was re-assessed after the procedure.                        After obtaining informed consent, the colonoscope was                         passed under direct vision. Throughout the procedure,                         the patient's blood pressure, pulse, and oxygen                         saturations were monitored continuously. The                         Colonoscope was introduced through the anus and  advanced to the the cecum, identified by appendiceal                         orifice and ileocecal valve. The colonoscopy was                         somewhat difficult due to significant looping.                         Successful completion of the procedure was aided by                         applying abdominal pressure. The patient tolerated the                         procedure well. The quality of the bowel preparation                         was good. Findings:      Hemorrhoids were found on perianal exam.      A few small-mouthed diverticula were found in the sigmoid colon.      External and internal hemorrhoids were found during retroflexion and        during perianal exam. The hemorrhoids were Grade II (internal       hemorrhoids that prolapse but reduce spontaneously).      A 3 mm polyp was found in the rectum. The polyp was sessile. The polyp       was removed with a cold snare. Resection and retrieval were complete.       Estimated blood loss was minimal. There were small hyperplastic polyps       in the rectum that were not removed.      The exam was otherwise normal throughout the examined colon. Made       several attempts for TI intubation but due to severe episode of       coughing, no further attempts made.      Biopsies for histology were taken with a cold forceps from the entire       colon for evaluation of microscopic colitis. Impression:            - Hemorrhoids found on perianal exam.                        - Diverticulosis in the sigmoid colon.                        - External and internal hemorrhoids.                        - One 3 mm polyp in the rectum, removed with a cold                         snare. Resected and retrieved.                        - Biopsies were taken with a cold forceps from the                         entire colon for evaluation of microscopic colitis. Recommendation:        -  Discharge patient to home.                        - Resume previous diet.                        - Continue present medications.                        - Await pathology results.                        - Repeat colonoscopy for surveillance based on                         pathology results.                        - Return to referring physician as previously                         scheduled. If pathology results unrevealing and still                         with persistent diarrhea can consider CTE to rule out                         small bowel inflammation. Procedure Code(s):     --- Professional ---                        [phone], Colonoscopy, flexible; with removal of                         tumor(s), polyp(s), or  other lesion(s) by snare                         technique                        [id number], 41, Colonoscopy, flexible; with biopsy, single                         or multiple Diagnosis Code(s):     --- Professional ---                        K64.1, Second degree hemorrhoids                        K62.1, Rectal polyp                        K52.9, Noninfective gastroenteritis and colitis,                         unspecified                        K57.30, Diverticulosis of large intestine without                         perforation or abscess without bleeding CPT copyright 2019 American Medical Association. All rights reserved. The codes documented in this  report are preliminary and upon coder review may  be revised to meet current compliance requirements. Andrey Farmer, MD Andrey Farmer MD, MD 11/06/2019 8:33:13 AM Number of Addenda: 0 Note Initiated On: 11/06/2019 7:12 AM Scope Withdrawal Time: 0 hours 12 minutes 41 seconds  Total Procedure Duration: 0 hours 26 minutes 35 seconds  Estimated Blood Loss:  Estimated blood loss was minimal.      [facility]

## 2019-11-06 NOTE — H&P (Signed)
Outpatient short stay form Pre-procedure 11/06/2019 7:51 AM Brittany Miyamoto MD, MPH  Primary Physician: Dr. Parks Ranger  Reason for visit:  Diarrhea/Surveillance  History of present illness:   69 y/o lady with new onset diarrhea for several months. 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 Occasional hematochezia and pus. No blood thinners. No family history of any GI malignancies. History of cholecystectomy.    Current Facility-Administered Medications:    0.9 %  sodium chloride infusion, , Intravenous, Continuous, Henriette Hesser, Hilton Cork, MD, Last Rate: 20 mL/hr at 11/06/19 0737, New Bag at 11/06/19 0737  Medications Prior to Admission  Medication Sig Dispense Refill Last Dose   traZODone (DESYREL) 100 MG tablet TAKE 1 TABLET BY MOUTH EVERY DAY AT NIGHT 90 tablet 3      Allergies  Allergen Reactions   Epoxytropine  [Scopolamine] Nausea Only    dizziness   Other     Other reaction(s): Cough Balsam of [place] and black rubber mix- pt states black rubber, "makes my skin split."   Nickel Rash     Past Medical History:  Diagnosis Date   Allergy    Anxiety    Colon polyps    Depression     Review of systems:  Otherwise negative.    Physical Exam  Gen: Alert, oriented. Appears stated age.  HEENT: [place]/AT. PERRLA. Lungs: no respiratory distress Abd: soft, benign, no masses. Ext: No edema. Pulses 2+    Planned procedures: Proceed with colonoscopy. The patient understands the nature of the planned procedure, indications, risks, alternatives and potential complications including but not limited to bleeding, infection, perforation, damage to internal organs and possible oversedation/side effects from anesthesia. The patient agrees and gives consent to proceed.  Please refer to procedure notes for findings, recommendations and patient disposition/instructions.     Brittany Miyamoto MD, MPH Gastroenterology 11/06/2019  7:51 AM

## 2019-11-09 ENCOUNTER — Encounter: Payer: Self-pay | Admitting: Gastroenterology

## 2019-11-09 LAB — SURGICAL PATHOLOGY

## 2019-11-12 ENCOUNTER — Ambulatory Visit: Payer: Medicare Other | Admitting: Dermatology

## 2019-11-12 ENCOUNTER — Other Ambulatory Visit: Payer: Self-pay

## 2019-11-12 DIAGNOSIS — Z1283 Encounter for screening for malignant neoplasm of skin: Secondary | ICD-10-CM | POA: Diagnosis not present

## 2019-11-12 DIAGNOSIS — I8310 Varicose veins of unspecified lower extremity with inflammation: Secondary | ICD-10-CM

## 2019-11-12 DIAGNOSIS — L820 Inflamed seborrheic keratosis: Secondary | ICD-10-CM | POA: Diagnosis not present

## 2019-11-12 DIAGNOSIS — L821 Other seborrheic keratosis: Secondary | ICD-10-CM

## 2019-11-12 DIAGNOSIS — L720 Epidermal cyst: Secondary | ICD-10-CM

## 2019-11-12 DIAGNOSIS — D229 Melanocytic nevi, unspecified: Secondary | ICD-10-CM | POA: Diagnosis not present

## 2019-11-12 DIAGNOSIS — D1800 Hemangioma unspecified site: Secondary | ICD-10-CM

## 2019-11-12 DIAGNOSIS — L814 Other melanin hyperpigmentation: Secondary | ICD-10-CM

## 2019-11-12 DIAGNOSIS — L578 Other skin changes due to chronic exposure to nonionizing radiation: Secondary | ICD-10-CM

## 2019-11-12 NOTE — Patient Instructions (Signed)
Brittany Weeks [address] [place] [address]   You have an upcoming surgery appointment scheduled at [facility]. @FUTURESURGAPPT @  PRE-OPERATIVE INSTRUCTIONS  We recommend you read the following instructions. If you have any questions or concerns, please call the office at [phone].  1. Shower and wash the entire body with soap and water the day of your surgery paying special attention to cleansing at and around the planned surgery site. 2. Avoid aspirin or aspirin containing products at least ten (10) days prior to your surgical procedure and for at least one week (7 days) after your surgical procedure. If you take aspirin on a regular basis for heart disease or history of stroke or for any other reason, we may recommend you continue taking aspirin but please notify us if you take this on a regular basis. Aspirin can cause more bleeding to occur during surgery as well as prolonged bleeding and bruising after surgery. 3. Avoid other nonsteroidal pain medications at least one week prior to surgery and at least one week after your surgery. These include medications such as Ibuprofen (Motrin, Advil and Nuprin), Naprosyn, Voltaren, Relafen, etc. If these medications are used for therapeutic reasons, please inform us as they can cause increased bleeding or prolonged bleeding during and bruising after surgical procedures.  4. Please advise [place] if you are taking any "blood thinner" medications such as Coumadin or Dipyridamole or Plavix or similar medications. These cause increased bleeding and prolonged bleeding during and bruising after surgical procedures. We may have to consider discontinuing these medications briefly prior to and shortly after your surgery, if safe to do so. 5. Please inform us of all medications you are currently taking. All medications that are taken regularly should be taken the day of surgery as you always do. Nevertheless, we need to be informed of what  medications you are taking prior to surgery to decide whether they will affect the procedure or cause any complications. 6. Please inform us of any medication allergies. Also inform us of whether you have allergies to Latex or rubber products or whether you have had any adverse reaction to Lidocaine or Epinephrine. 7. Please inform us of any prosthetic or artificial body parts such as artificial heart valve, joint replacements, etc., or similar condition that might require preoperative antibiotics. 8. We recommend avoidance of alcohol at least two weeks prior to surgery and continued avoidance for at least two weeks after surgery. 9. We recommend discontinuation of tobacco smoking at least two weeks prior to surgery and continued abstinence for at least two weeks after surgery. 10. Do not plan strenuous exercise, strenuous work or strenuous lifting for approximately four weeks after your surgery. 11. We request if you are unable to make your scheduled surgical appointment, please call us at least a week in advance or as soon as you are aware of a problem so that we can cancel or reschedule you. 12. You MAY TAKE TYLENOL (acetaminophen) for pain as it is not a blood thinner. 13. PLEASE PLAN TO BE IN TOWN FOR TWO WEEKS FOLLOWING SURGERY. THIS IS IMPORTANT SO YOU CAN BE CHECKED FOR DRESSING CHANGES, SUTURE REMOVAL AND TO MONITOR FOR POSSIBLE COMPLICATIONS.

## 2019-11-12 NOTE — Progress Notes (Signed)
° °  New Patient Visit  Subjective  Brittany Weeks is a 69 y.o. female who presents for the following: Annual Exam (patient has a cyst on her chest that she would like to have removed - no hx of skin CA or abnormal nevi). The patient presents for Total-Body Skin Exam (TBSE) for skin cancer screening and mole check.  The following portions of the chart were reviewed this encounter and updated as appropriate:  Tobacco   Allergies   Meds   Problems   Med Hx   Surg Hx   Fam Hx      Review of Systems:  No other skin or systemic complaints except as noted in HPI or Assessment and Plan.  Objective  Well appearing patient in no apparent distress; mood and affect are within normal limits.  A full examination was performed including scalp, head, eyes, ears, nose, lips, neck, chest, axillae, abdomen, back, buttocks, bilateral upper extremities, bilateral lower extremities, hands, feet, fingers, toes, fingernails, and toenails. All findings within normal limits unless otherwise noted below.  Objective  R nose, R cheek (2): Erythematous keratotic or waxy stuck-on papule or plaque.   Objective  L sternum: 1.5 cm firm SQ nodule   L pubic area: 0.5 cm firm SQ nodule   Assessment & Plan  Inflamed seborrheic keratosis (2) R nose, R cheek  Destruction of lesion - R nose, R cheek Complexity: simple   Destruction method: cryotherapy   Informed consent: discussed and consent obtained   Timeout:  patient name, date of birth, surgical site, and procedure verified Lesion destroyed using liquid nitrogen: Yes   Region frozen until ice ball extended beyond lesion: Yes   Outcome: patient tolerated procedure well with no complications   Post-procedure details: wound care instructions given    Epidermal inclusion cyst (2) L sternum; L pubic area  Discussed surgical excision with patient (she has had in the past at our office).  Skin cancer screening   Lentigines - Scattered tan macules -  Discussed due to sun exposure - Benign, observe - Call for any changes  Seborrheic Keratoses - Stuck-on, waxy, tan-brown papules and plaques  - Discussed benign etiology and prognosis. - Observe - Call for any changes  Melanocytic Nevi - Tan-brown and/or pink-flesh-colored symmetric macules and papules - Benign appearing on exam today - Observation - Call clinic for new or changing moles - Recommend daily use of broad spectrum spf 30+ sunscreen to sun-exposed areas.   Hemangiomas - Red papules - Discussed benign nature - Observe - Call for any changes  Actinic Damage - diffuse scaly erythematous macules with underlying dyspigmentation - Recommend daily broad spectrum sunscreen SPF 30+ to sun-exposed areas, reapply every 2 hours as needed.  - Call for new or changing lesions.  Spider Veins - Dilated blue, purple or red veins at the lower extremities - Reassured - These can be treated by sclerotherapy (a procedure to inject a medicine into the veins to make them disappear) if desired, but the treatment is not covered by insurance  Skin cancer screening performed today.  Return for surgery - cyst of the chest .  I, Rudell Cobb, CMA, am acting as scribe for Sarina Ser, MD .  Documentation: I have reviewed the above documentation for accuracy and completeness, and I agree with the above.  Sarina Ser, MD

## 2019-11-20 ENCOUNTER — Encounter: Payer: Self-pay | Admitting: Dermatology

## 2019-11-27 ENCOUNTER — Other Ambulatory Visit: Payer: Self-pay | Admitting: Family Medicine

## 2019-11-27 DIAGNOSIS — F3342 Major depressive disorder, recurrent, in full remission: Secondary | ICD-10-CM

## 2019-11-27 NOTE — Telephone Encounter (Signed)
Requested Prescriptions  Pending Prescriptions Disp Refills   traZODone (DESYREL) 100 MG tablet [Pharmacy Med Name: TRAZODONE 100 MG TABLET] 90 tablet 1    Sig: TAKE 1 TABLET BY MOUTH EVERY DAY AT NIGHT     Psychiatry: Antidepressants - Serotonin Modulator Passed - 11/27/2019  1:56 PM      Passed - Completed PHQ-2 or PHQ-9 in the last 360 days.      Passed - Valid encounter within last 6 months    Recent Outpatient Visits          1 month ago Annual physical exam   [place], DO   1 year ago Annual physical exam   [facility] Olin Hauser, DO   2 years ago Annual physical exam   [facility] Olin Hauser, DO   2 years ago Major depression, recurrent, full remission [facility])   [place], DO      Future Appointments            In 10 months Parks Ranger, Devonne Doughty, DO [facility]

## 2020-01-12 ENCOUNTER — Other Ambulatory Visit: Payer: Self-pay

## 2020-01-12 ENCOUNTER — Ambulatory Visit: Payer: Medicare Other | Admitting: Dermatology

## 2020-01-12 DIAGNOSIS — L720 Epidermal cyst: Secondary | ICD-10-CM | POA: Diagnosis not present

## 2020-01-12 MED ORDER — MUPIROCIN 2 % EX OINT: 1.0000 "application " | TOPICAL_OINTMENT | Freq: Every day | CUTANEOUS | 0 refills | Status: DC

## 2020-01-12 NOTE — Patient Instructions (Signed)

## 2020-01-12 NOTE — Progress Notes (Signed)
   Follow-Up Visit   Subjective  Brittany Weeks is a 69 y.o. female who presents for the following: Cyst (L sternum, pt presents for excision).  The following portions of the chart were reviewed this encounter and updated as appropriate:  Tobacco  Allergies  Meds  Problems  Med Hx  Surg Hx  Fam Hx     Review of Systems:  No other skin or systemic complaints except as noted in HPI or Assessment and Plan.  Objective  Well appearing patient in no apparent distress; mood and affect are within normal limits.  A focused examination was performed including chest. Relevant physical exam findings are noted in the Assessment and Plan.  Objective  Left sternum: Cystic pap 2.2cm   Assessment & Plan  Epidermal cyst Left sternum  Cyst vs other, excision today  Start mupirocin oint qd to excision  mupirocin ointment (BACTROBAN) 2 % - Left sternum  Skin excision - Left sternum  Lesion length (cm):  2.2 Lesion width (cm):  2.2 Margin per side (cm):  0.1 Total excision diameter (cm):  2.4 Informed consent: discussed and consent obtained   Timeout: patient name, date of birth, surgical site, and procedure verified   Procedure prep:  Patient was prepped and draped in usual sterile fashion Prep type:  Isopropyl alcohol and povidone-iodine Anesthesia: the lesion was anesthetized in a standard fashion   Anesthetic:  1% lidocaine w/ epinephrine 1-100,000 buffered w/ 8.4% NaHCO3 (8cc) Instrument used: #15 blade   Hemostasis achieved with: pressure   Hemostasis achieved with comment:  Electrocautery Outcome: patient tolerated procedure well with no complications   Post-procedure details: sterile dressing applied and wound care instructions given   Dressing type: bandage and pressure dressing (Mupirocin)    Skin repair - Left sternum Complexity:  Complex Final length (cm):  3 Reason for type of repair: reduce tension to allow closure, reduce the risk of dehiscence, infection, and  necrosis, reduce subcutaneous dead space and avoid a hematoma, allow closure of the large defect, preserve normal anatomy, preserve normal anatomical and functional relationships and enhance both functionality and cosmetic results   Undermining: area extensively undermined   Undermining comment:  Undermining Defect 2.4cm Subcutaneous layers (deep stitches):  Suture size:  4-0 Suture type: Vicryl (polyglactin 910)   Subcutaneous suture technique: Inverted Dermal. Fine/surface layer approximation (top stitches):  Suture size:  4-0 Suture type: nylon   Stitches: simple running   Suture removal (days):  7 Hemostasis achieved with: pressure Outcome: patient tolerated procedure well with no complications   Post-procedure details: sterile dressing applied and wound care instructions given   Dressing type: bandage, pressure dressing and bacitracin (Mupirocin)    Specimen 1 - Surgical pathology Differential Diagnosis: D48.5 Cyst vs other Check Margins: No Cystic pap 2.2cm, with nevus at inferior tip, please comment on nevus  Return in about 7 years (around 01/12/2027) for suture removal.  I, Sonya Hupman, RMA, am acting as scribe for Sarina Ser, MD .  Documentation: I have reviewed the above documentation for accuracy and completeness, and I agree with the above.  Sarina Ser, MD

## 2020-01-13 ENCOUNTER — Encounter: Payer: Self-pay | Admitting: Dermatology

## 2020-01-13 ENCOUNTER — Telehealth: Payer: Self-pay

## 2020-01-13 NOTE — Telephone Encounter (Signed)
Patient doing fine after yesterdays surgery./sh 

## 2020-01-19 ENCOUNTER — Ambulatory Visit (INDEPENDENT_AMBULATORY_CARE_PROVIDER_SITE_OTHER): Payer: Medicare Other | Admitting: Dermatology

## 2020-01-19 ENCOUNTER — Other Ambulatory Visit: Payer: Self-pay

## 2020-01-19 DIAGNOSIS — D485 Neoplasm of uncertain behavior of skin: Secondary | ICD-10-CM

## 2020-01-19 DIAGNOSIS — L720 Epidermal cyst: Secondary | ICD-10-CM

## 2020-01-19 DIAGNOSIS — Z4802 Encounter for removal of sutures: Secondary | ICD-10-CM

## 2020-01-19 NOTE — Progress Notes (Signed)
   Follow-Up Visit   Subjective  Brittany Weeks is a 69 y.o. female who presents for the following: post op./suture removal (patient is here today to have her sutures removed from the sternum and to discuss pathology results).  The following portions of the chart were reviewed this encounter and updated as appropriate:  Tobacco  Allergies  Meds  Problems  Med Hx  Surg Hx  Fam Hx     Review of Systems:  No other skin or systemic complaints except as noted in HPI or Assessment and Plan.  Objective  Well appearing patient in no apparent distress; mood and affect are within normal limits.  A focused examination was performed including the sternum. Relevant physical exam findings are noted in the Assessment and Plan.  Objective  sternum: Healing excision site.  Assessment & Plan  Epidermoid cyst sternum  Encounter for Removal of Sutures - Incision site at the sternum is clean, dry and intact - Wound cleansed, sutures removed, wound cleansed and steri strips applied.  - Pathology results showed a benign cyst. - Patient advised to keep steri-strips dry until they fall off. - Scars remodel for a full year. - Once steri-strips fall off, patient can apply over-the-counter silicone scar cream each night to help with scar remodeling if desired. - Patient advised to call with any concerns or if they notice any new or changing lesions.   Return in about 1 year (around 01/18/2021) for TBSE.  Luther Redo, CMA, am acting as scribe for Sarina Ser, MD .  Documentation: I have reviewed the above documentation for accuracy and completeness, and I agree with the above.  Sarina Ser, MD

## 2020-01-20 ENCOUNTER — Telehealth: Payer: Self-pay

## 2020-01-20 ENCOUNTER — Encounter: Payer: Self-pay | Admitting: Dermatology

## 2020-01-20 NOTE — Telephone Encounter (Signed)
Patient informed of pathology results 

## 2020-04-20 ENCOUNTER — Telehealth: Payer: Medicare Other | Admitting: Family Medicine

## 2020-04-20 NOTE — Telephone Encounter (Signed)
OK to wait for Dr. K 

## 2020-04-20 NOTE — Telephone Encounter (Signed)
Patient declined the Medicare Wellness Visit with [facility] -stated she does not need to complete that she comes in once a year to see her PCP. Offered to complete via phone or virtual and she still declined

## 2020-05-25 ENCOUNTER — Other Ambulatory Visit: Payer: Self-pay | Admitting: Family Medicine

## 2020-05-25 DIAGNOSIS — F3342 Major depressive disorder, recurrent, in full remission: Secondary | ICD-10-CM

## 2020-05-25 MED ORDER — TRAZODONE HCL 100 MG PO TABS: 100.0000 mg | ORAL_TABLET | Freq: Every day | ORAL | 1 refills | Status: DC

## 2020-05-25 NOTE — Telephone Encounter (Signed)
Requested medication (s) are due for refill today: yes  Requested medication (s) are on the active medication list: yes  Last refill:  11/27/2019  Future visit scheduled: yes  Notes to clinic: Patient requesting refill but doesn't have appt until 09/2020 Review for refill    Requested Prescriptions  Pending Prescriptions Disp Refills   traZODone (DESYREL) 100 MG tablet 90 tablet 1      Psychiatry: Antidepressants - Serotonin Modulator Failed - 05/25/2020  9:18 AM      Failed - Valid encounter within last 6 months    Recent Outpatient Visits           7 months ago Annual physical exam   [place], DO   1 year ago Annual physical exam   [place], DO   2 years ago Annual physical exam   [facility] Olin Hauser, DO   2 years ago Major depression, recurrent, full remission [facility])   [place], DO       Future Appointments             In 4 months Parks Ranger, Devonne Doughty, DO [place] - Completed PHQ-2 or PHQ-9 in the last 360 days

## 2020-05-25 NOTE — Telephone Encounter (Signed)
Medication Refill - Medication: traZODone (DESYREL) 100 MG tablet     Preferred Pharmacy (with phone number or street name):  [facility], [place] Phone:  [phone]  Fax:  [phone]       Agent: Please be advised that RX refills may take up to 3 business days. We ask that you follow-up with your pharmacy.

## 2020-10-04 ENCOUNTER — Other Ambulatory Visit: Payer: Self-pay

## 2020-10-04 DIAGNOSIS — Z0000 Encounter for general adult medical examination without abnormal findings: Secondary | ICD-10-CM

## 2020-10-04 DIAGNOSIS — E7800 Pure hypercholesterolemia, unspecified: Secondary | ICD-10-CM

## 2020-10-04 DIAGNOSIS — F3342 Major depressive disorder, recurrent, in full remission: Secondary | ICD-10-CM

## 2020-10-04 DIAGNOSIS — R7309 Other abnormal glucose: Secondary | ICD-10-CM

## 2020-10-05 ENCOUNTER — Other Ambulatory Visit: Payer: Self-pay

## 2020-10-05 ENCOUNTER — Other Ambulatory Visit: Payer: Medicare Other

## 2020-10-05 DIAGNOSIS — R7309 Other abnormal glucose: Secondary | ICD-10-CM | POA: Diagnosis not present

## 2020-10-05 DIAGNOSIS — Z0000 Encounter for general adult medical examination without abnormal findings: Secondary | ICD-10-CM | POA: Diagnosis not present

## 2020-10-05 DIAGNOSIS — E7800 Pure hypercholesterolemia, unspecified: Secondary | ICD-10-CM | POA: Diagnosis not present

## 2020-10-06 LAB — COMPLETE METABOLIC PANEL WITH GFR
AG Ratio: 1.6 (calc) (ref 1.0–2.5)
ALT: 11 U/L (ref 6–29)
AST: 13 U/L (ref 10–35)
Albumin: 4.4 g/dL (ref 3.6–5.1)
Alkaline phosphatase (APISO): 72 U/L (ref 37–153)
BUN: 10 mg/dL (ref 7–25)
CO2: 28 mmol/L (ref 20–32)
Calcium: 9.6 mg/dL (ref 8.6–10.4)
Chloride: 104 mmol/L (ref 98–110)
Creat: 0.85 mg/dL (ref 0.60–1.00)
Globulin: 2.8 g/dL (calc) (ref 1.9–3.7)
Glucose, Bld: 98 mg/dL (ref 65–99)
Potassium: 4.2 mmol/L (ref 3.5–5.3)
Sodium: 141 mmol/L (ref 135–146)
Total Bilirubin: 0.4 mg/dL (ref 0.2–1.2)
Total Protein: 7.2 g/dL (ref 6.1–8.1)
eGFR: 73 mL/min/{1.73_m2} (ref >=60)

## 2020-10-06 LAB — CBC WITH DIFFERENTIAL/PLATELET
Absolute Monocytes: 543 cells/uL (ref 200–950)
Basophils Absolute: 58 cells/uL (ref 0–200)
Basophils Relative: 0.6 %
Eosinophils Absolute: 155 cells/uL (ref 15–500)
Eosinophils Relative: 1.6 %
HCT: 47.6 % — ABNORMAL HIGH (ref 35.0–45.0)
Hemoglobin: 15.2 g/dL (ref 11.7–15.5)
Lymphs Abs: 3376 cells/uL (ref 850–3900)
MCH: 32.1 pg (ref 27.0–33.0)
MCHC: 31.9 g/dL — ABNORMAL LOW (ref 32.0–36.0)
MCV: 100.6 fL — ABNORMAL HIGH (ref 80.0–100.0)
MPV: 10.6 fL (ref 7.5–12.5)
Monocytes Relative: 5.6 %
Neutro Abs: 5568 cells/uL (ref 1500–7800)
Neutrophils Relative %: 57.4 %
Platelets: 304 10*3/uL (ref 140–400)
RBC: 4.73 10*6/uL (ref 3.80–5.10)
RDW: 12.7 % (ref 11.0–15.0)
Total Lymphocyte: 34.8 %
WBC: 9.7 10*3/uL (ref 3.8–10.8)

## 2020-10-06 LAB — LIPID PANEL
Cholesterol: 191 mg/dL (ref <200)
HDL: 64 mg/dL (ref >=50)
LDL Cholesterol (Calc): 106 mg/dL (calc) — ABNORMAL HIGH
Non-HDL Cholesterol (Calc): 127 mg/dL (calc) (ref <130)
Total CHOL/HDL Ratio: 3 (calc) (ref <5.0)
Triglycerides: 111 mg/dL (ref <150)

## 2020-10-06 LAB — TSH: TSH: 1.22 mIU/L (ref 0.40–4.50)

## 2020-10-06 LAB — HEMOGLOBIN A1C
Hgb A1c MFr Bld: 5.5 % of total Hgb (ref <5.7)
Mean Plasma Glucose: 111 mg/dL
eAG (mmol/L): 6.2 mmol/L

## 2020-10-12 ENCOUNTER — Other Ambulatory Visit: Payer: Self-pay | Admitting: Family Medicine

## 2020-10-12 ENCOUNTER — Other Ambulatory Visit: Payer: Self-pay

## 2020-10-12 ENCOUNTER — Ambulatory Visit (INDEPENDENT_AMBULATORY_CARE_PROVIDER_SITE_OTHER): Payer: Medicare Other | Admitting: Family Medicine

## 2020-10-12 ENCOUNTER — Encounter: Payer: Self-pay | Admitting: Family Medicine

## 2020-10-12 VITALS — BP 133/63 | HR 90 | Ht 63.0 in | Wt 153.0 lb

## 2020-10-12 DIAGNOSIS — E7800 Pure hypercholesterolemia, unspecified: Secondary | ICD-10-CM

## 2020-10-12 DIAGNOSIS — Z0000 Encounter for general adult medical examination without abnormal findings: Secondary | ICD-10-CM | POA: Diagnosis not present

## 2020-10-12 DIAGNOSIS — F3342 Major depressive disorder, recurrent, in full remission: Secondary | ICD-10-CM

## 2020-10-12 DIAGNOSIS — R7309 Other abnormal glucose: Secondary | ICD-10-CM

## 2020-10-12 MED ORDER — TRAZODONE HCL 100 MG PO TABS: 100.0000 mg | ORAL_TABLET | Freq: Every day | ORAL | 3 refills | Status: DC

## 2020-10-12 NOTE — Progress Notes (Signed)
Subjective:    Patient ID: Brittany Weeks, female    DOB: 1950/08/12, 70 y.o.   MRN: [id number]  Brittany Weeks is a 70 y.o. 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 Doing well on Trazodone 127m nightly still   HYPERLIPIDEMIA: Last lipid 09/2020, elevated LDL mild but improved, otherwise normal HDL Declines statin therapy again Admits improved healthy diet, except does eat dessert often   Tobacco Abuse Chronic history smoker 1ppd >50 years. Not interested in quitting. Has not had lung CA screening with CT, not interested at this time.     Health Maintenance:   Cervical Cancer Screening - no longer due, s/p last pap at age [age]negative, declines further screening   Not updated on COVID19 vaccine.   Colonoscopy last updated 11/06/19. Had hyperplastic polyp. Good for 10 years. [facility] next 2031 if interested  Breast CA Screening: Due for mammogram screening. Last mammogram result normal (09/01/19). Known family history with 511year old daughter with breast cancer. Currently asymptomatic.   Last DEXA 2014, she was previously treated with Fosamax back in 2004. Now declines further treatment or testing.   [facility] 10/12/2020 10/08/2019 10/07/2018  Decreased Interest 0 0 0  Down, Depressed, Hopeless 0 0 0  PHQ - 2 Score 0 0 0  Altered sleeping 0 0 0  Tired, decreased energy 1 0 0  Change in appetite 0 0 0  Feeling bad or failure about yourself  0 0 0  Trouble concentrating 0 0 0  Moving slowly or fidgety/restless 0 0 0  Suicidal thoughts 0 0 0  PHQ-9 Score 1 0 0  Difficult doing  work/chores Not difficult at all Not difficult at all Not difficult at all   GAD 7 : Generalized Anxiety Score 10/07/2018 10/02/2017 09/03/2017  Nervous, Anxious, on Edge 0 0 0  Control/stop worrying 0 0 0  Worry too much - different things 0 0 0  Trouble relaxing 0 0 0  Restless 0 0 0  Easily annoyed or irritable 0 0 0  Afraid - awful might happen 0 0 0  Total GAD 7 Score 0 0 0  Anxiety Difficulty Not difficult at all Not difficult at all Not difficult at all     Past Medical History:  Diagnosis Date   Allergy    Anxiety    Colon polyps    Depression    Past Surgical History:  Procedure Laterality Date   COLONOSCOPY WITH PROPOFOL N/A 11/06/2019   Procedure: COLONOSCOPY WITH PROPOFOL;  Surgeon: LLesly Rubenstein MD;  [facility];  Service: Endoscopy;  Laterality: N/A;   GALLBLADDER SURGERY  1974   TONSILLECTOMY Bilateral 1960   Social History   Socioeconomic History   Marital status: Married    Spouse name: Not on file   Number of children: Not on file   Years of education: High School   Highest education level: High school graduate  Occupational History   Not on file  Tobacco Use   Smoking status: Every Day    Packs/day: 1.00    Years: 50.00    Pack years: 50.00  Types: Cigarettes   Smokeless tobacco: Current  Vaping Use   Vaping Use: Never used  Substance and Sexual Activity   Alcohol use: Yes    Alcohol/week: 1.0 standard drink    Types: 1 Standard drinks or equivalent per week   Drug use: Never   Sexual activity: Not on file  Other Topics Concern   Not on file  Social History Narrative   Not on file   Social Determinants of Health   Financial Resource Strain: Not on file  Food Insecurity: Not on file  Transportation Needs: Not on file  Physical Activity: Not on file  Stress: Not on file  Social Connections: Not on file  Intimate Partner Violence: Not on file   Family History  Problem Relation Age of Onset   Leukemia Mother     Dementia Father    Breast cancer Daughter    Current Outpatient Medications on File Prior to Visit  Medication Sig   mupirocin ointment (BACTROBAN) 2 % Apply 1 application topically daily. Qd to excision site (Patient not taking: No sig reported)   No current facility-administered medications on file prior to visit.    Review of Systems  Constitutional:  Negative for activity change, appetite change, chills, diaphoresis, fatigue and fever.  HENT:  Negative for congestion and hearing loss.   Eyes:  Negative for visual disturbance.  Respiratory:  Negative for cough, chest tightness, shortness of breath and wheezing.   Cardiovascular:  Negative for chest pain, palpitations and leg swelling.  Gastrointestinal:  Negative for abdominal pain, constipation, diarrhea, nausea and vomiting.  Genitourinary:  Negative for dysuria, frequency and hematuria.  Musculoskeletal:  Negative for arthralgias and neck pain.  Skin:  Negative for rash.  Allergic/Immunologic: Negative for environmental allergies.  Neurological:  Negative for dizziness, weakness, light-headedness, numbness and headaches.  Hematological:  Negative for adenopathy.  Psychiatric/Behavioral:  Negative for behavioral problems, dysphoric mood and sleep disturbance.   Per HPI unless specifically indicated above      Objective:    BP 133/63   Pulse 90   Ht _0  (1.6 m)   Wt 153 lb (69.4 kg)   LMP  (LMP Unknown)   SpO2 96%   BMI 27.10 kg/m   Wt Readings from Last 3 Encounters:  10/12/20 153 lb (69.4 kg)  11/06/19 150 lb (68 kg)  10/08/19 149 lb 9.6 oz (67.9 kg)    Physical Exam Vitals and nursing note reviewed.  Constitutional:      General: She is not in acute distress.    Appearance: She is well-developed. She is not diaphoretic.     Comments: Well-appearing, comfortable, cooperative  HENT:     Head: Normocephalic and atraumatic.  Eyes:     General:        Right eye: No discharge.        Left eye: No discharge.      Conjunctiva/sclera: Conjunctivae normal.     Pupils: Pupils are equal, round, and reactive to light.  Neck:     Thyroid: No thyromegaly.     Vascular: No carotid bruit.  Cardiovascular:     Rate and Rhythm: Normal rate and regular rhythm.     Pulses: Normal pulses.     Heart sounds: Normal heart sounds. No murmur heard. Pulmonary:     Effort: Pulmonary effort is normal. No respiratory distress.     Breath sounds: Normal breath sounds. No wheezing or rales.  Abdominal:     General: Bowel sounds are normal.  There is no distension.     Palpations: Abdomen is soft. There is no mass.     Tenderness: There is no abdominal tenderness.  Musculoskeletal:        General: No tenderness. Normal range of motion.     Cervical back: Normal range of motion and neck supple.     Right lower leg: No edema.     Left lower leg: No edema.     Comments: Upper / Lower Extremities: - Normal muscle tone, strength bilateral upper extremities 5/5, lower extremities 5/5  Lymphadenopathy:     Cervical: No cervical adenopathy.  Skin:    General: Skin is warm and dry.     Findings: No erythema or rash.     Comments: Mild varicose veins lower extremities  Neurological:     Mental Status: She is alert and oriented to person, place, and time.     Comments: Distal sensation intact to light touch all extremities  Psychiatric:        Mood and Affect: Mood normal.        Behavior: Behavior normal.        Thought Content: Thought content normal.     Comments: Well groomed, good eye contact, normal speech and thoughts      Results for orders placed or performed in visit on 10/04/20  TSH  Result Value Ref Range   TSH 1.22 0.40 - 4.50 mIU/L  Lipid panel  Result Value Ref Range   Cholesterol 191 <200 mg/dL   HDL 64 > OR = 50 mg/dL   Triglycerides 111 <150 mg/dL   LDL Cholesterol (Calc) 106 (H) mg/dL (calc)   Total CHOL/HDL Ratio 3.0 <5.0 (calc)   Non-HDL Cholesterol (Calc) 127 <130 mg/dL (calc)  COMPLETE  METABOLIC PANEL WITH GFR  Result Value Ref Range   Glucose, Bld 98 65 - 99 mg/dL   BUN 10 7 - 25 mg/dL   Creat 0.85 0.60 - 1.00 mg/dL   eGFR 73 > OR = 60 mL/min/1.23m   BUN/Creatinine Ratio NOT APPLICABLE 6 - 22 (calc)   Sodium 141 135 - 146 mmol/L   Potassium 4.2 3.5 - 5.3 mmol/L   Chloride 104 98 - 110 mmol/L   CO2 28 20 - 32 mmol/L   Calcium 9.6 8.6 - 10.4 mg/dL   Total Protein 7.2 6.1 - 8.1 g/dL   Albumin 4.4 3.6 - 5.1 g/dL   Globulin 2.8 1.9 - 3.7 g/dL (calc)   AG Ratio 1.6 1.0 - 2.5 (calc)   Total Bilirubin 0.4 0.2 - 1.2 mg/dL   Alkaline phosphatase (APISO) 72 37 - 153 U/L   AST 13 10 - 35 U/L   ALT 11 6 - 29 U/L  CBC with Differential/Platelet  Result Value Ref Range   WBC 9.7 3.8 - 10.8 Thousand/uL   RBC 4.73 3.80 - 5.10 Million/uL   Hemoglobin 15.2 11.7 - 15.5 g/dL   HCT 47.6 (H) 35.0 - 45.0 %   MCV 100.6 (H) 80.0 - 100.0 fL   MCH 32.1 27.0 - 33.0 pg   MCHC 31.9 (L) 32.0 - 36.0 g/dL   RDW 12.7 11.0 - 15.0 %   Platelets 304 140 - 400 Thousand/uL   MPV 10.6 7.5 - 12.5 fL   Neutro Abs 5,568 1,500 - 7,800 cells/uL   Lymphs Abs 3,376 850 - 3,900 cells/uL   Absolute Monocytes 543 200 - 950 cells/uL   Eosinophils Absolute 155 15 - 500 cells/uL   Basophils Absolute 58 0 -  200 cells/uL   Neutrophils Relative % 57.4 %   Total Lymphocyte 34.8 %   Monocytes Relative 5.6 %   Eosinophils Relative 1.6 %   Basophils Relative 0.6 %  Hemoglobin A1c  Result Value Ref Range   Hgb A1c MFr Bld 5.5 <5.7 % of total Hgb   Mean Plasma Glucose 111 mg/dL   eAG (mmol/L) 6.2 mmol/L      Assessment & Plan:   Problem List Items Addressed This Visit     Major depression, recurrent, full remission ([place])   Relevant Medications   traZODone (DESYREL) 100 MG tablet   Hyperlipidemia   Other Visit Diagnoses     Annual physical exam    -  Primary       Updated Health Maintenance information UTD Mammogram 08/2019, next 1 year, she does every 2 years. UTD Colonoscopy 10/2019, next 10  years Reviewed recent lab results with patient Encouraged improvement to lifestyle with diet and exercise Goal of weight loss   Meds ordered this encounter  Medications   traZODone (DESYREL) 100 MG tablet    Sig: Take 1 tablet (100 mg total) by mouth at bedtime.    Dispense:  90 tablet    Refill:  3    Add refills on file     Follow up plan: Return in about 1 year (around 10/12/2021) for 1 year for fasting lab only then 1 week later Annual Physical.  Future orders in  Nobie Putnam, [facility] 10/12/2020, 10:46 AM

## 2020-10-12 NOTE — Patient Instructions (Addendum)
Thank you for coming to the office today.  Mammogram every other year, next is 08/2021  Keep up the great work. Labs are good, sugar and cholesterol improved and controlled.  Use RICE therapy: - R - Rest / relative rest with activity modification avoid overuse of joint - I - Ice packs (make sure you use a towel or sock / something to protect skin) - C - Compression with stockings / ACE wrap to apply pressure and reduce swelling allowing more support - E - Elevation - if significant swelling, lift leg above heart level (toes above your nose) to help reduce swelling, most helpful at night after day of being on your feet   DUE for FASTING BLOOD WORK (no food or drink after midnight before the lab appointment, only water or coffee without cream/sugar on the morning of)  SCHEDULE "Lab Only" visit in the morning at the clinic for lab draw in 1 YEAR  - Make sure Lab Only appointment is at about 1 week before your next appointment, so that results will be available  For Lab Results, once available within 2-3 days of blood draw, you can can log in to MyChart online to view your results and a brief explanation. Also, we can discuss results at next follow-up visit.   Please schedule a Follow-up Appointment to: Return in about 1 year (around 10/12/2021) for 1 year for fasting lab only then 1 week later Annual Physical.  If you have any other questions or concerns, please feel free to call the office or send a message through Progress. You may also schedule an earlier appointment if necessary.  Additionally, you may be receiving a survey about your experience at our office within a few days to 1 week by e-mail or mail. We value your feedback.  Nobie Putnam, DO [place]

## 2021-01-19 ENCOUNTER — Encounter: Payer: Medicare Other | Admitting: Dermatology

## 2021-08-07 ENCOUNTER — Telehealth: Payer: Self-pay

## 2021-08-07 NOTE — Telephone Encounter (Signed)
Left a message for patient to call back and schedule their Medicare Annual Wellness Visit (AWV) virtually, telephone or face to face. ?  ?Hriday Stai, CMA ?[phone]  ?

## 2021-08-30 ENCOUNTER — Inpatient Hospital Stay
Admission: EM | Admit: 2021-08-30 | Discharge: 2021-09-04 | DRG: 481 | Disposition: A | Discharge status: Home Health | Payer: Medicare Other | Attending: Internal Medicine | Admitting: Internal Medicine

## 2021-08-30 ENCOUNTER — Emergency Department: Payer: Medicare Other

## 2021-08-30 ENCOUNTER — Other Ambulatory Visit: Payer: Self-pay

## 2021-08-30 ENCOUNTER — Encounter: Payer: Self-pay | Admitting: Emergency Medicine

## 2021-08-30 DIAGNOSIS — R42 Dizziness and giddiness: Secondary | ICD-10-CM

## 2021-08-30 DIAGNOSIS — Z888 Allergy status to other drugs, medicaments and biological substances status: Secondary | ICD-10-CM | POA: Diagnosis not present

## 2021-08-30 DIAGNOSIS — D62 Acute posthemorrhagic anemia: Secondary | ICD-10-CM | POA: Diagnosis not present

## 2021-08-30 DIAGNOSIS — Z043 Encounter for examination and observation following other accident: Secondary | ICD-10-CM | POA: Diagnosis not present

## 2021-08-30 DIAGNOSIS — S7291XA Unspecified fracture of right femur, initial encounter for closed fracture: Secondary | ICD-10-CM | POA: Diagnosis not present

## 2021-08-30 DIAGNOSIS — R52 Pain, unspecified: Secondary | ICD-10-CM | POA: Diagnosis not present

## 2021-08-30 DIAGNOSIS — M6281 Muscle weakness (generalized): Secondary | ICD-10-CM | POA: Diagnosis not present

## 2021-08-30 DIAGNOSIS — Z806 Family history of leukemia: Secondary | ICD-10-CM | POA: Diagnosis not present

## 2021-08-30 DIAGNOSIS — G4700 Insomnia, unspecified: Secondary | ICD-10-CM | POA: Diagnosis not present

## 2021-08-30 DIAGNOSIS — E785 Hyperlipidemia, unspecified: Secondary | ICD-10-CM | POA: Diagnosis present

## 2021-08-30 DIAGNOSIS — Z803 Family history of malignant neoplasm of breast: Secondary | ICD-10-CM | POA: Diagnosis not present

## 2021-08-30 DIAGNOSIS — S7221XA Displaced subtrochanteric fracture of right femur, initial encounter for closed fracture: Secondary | ICD-10-CM | POA: Diagnosis not present

## 2021-08-30 DIAGNOSIS — Z1152 Encounter for screening for COVID-19: Secondary | ICD-10-CM | POA: Diagnosis not present

## 2021-08-30 DIAGNOSIS — Z79899 Other long term (current) drug therapy: Secondary | ICD-10-CM

## 2021-08-30 DIAGNOSIS — E782 Mixed hyperlipidemia: Secondary | ICD-10-CM | POA: Diagnosis present

## 2021-08-30 DIAGNOSIS — F3342 Major depressive disorder, recurrent, in full remission: Secondary | ICD-10-CM | POA: Diagnosis present

## 2021-08-30 DIAGNOSIS — I499 Cardiac arrhythmia, unspecified: Secondary | ICD-10-CM | POA: Diagnosis not present

## 2021-08-30 DIAGNOSIS — Z743 Need for continuous supervision: Secondary | ICD-10-CM | POA: Diagnosis not present

## 2021-08-30 DIAGNOSIS — M25551 Pain in right hip: Secondary | ICD-10-CM | POA: Diagnosis not present

## 2021-08-30 DIAGNOSIS — F17210 Nicotine dependence, cigarettes, uncomplicated: Secondary | ICD-10-CM | POA: Diagnosis present

## 2021-08-30 DIAGNOSIS — J449 Chronic obstructive pulmonary disease, unspecified: Secondary | ICD-10-CM | POA: Diagnosis not present

## 2021-08-30 DIAGNOSIS — R918 Other nonspecific abnormal finding of lung field: Secondary | ICD-10-CM | POA: Diagnosis not present

## 2021-08-30 DIAGNOSIS — Y93H2 Activity, gardening and landscaping: Secondary | ICD-10-CM

## 2021-08-30 DIAGNOSIS — M19011 Primary osteoarthritis, right shoulder: Secondary | ICD-10-CM | POA: Diagnosis not present

## 2021-08-30 DIAGNOSIS — M79621 Pain in right upper arm: Secondary | ICD-10-CM | POA: Diagnosis not present

## 2021-08-30 DIAGNOSIS — Y92017 Garden or yard in single-family (private) house as the place of occurrence of the external cause: Secondary | ICD-10-CM

## 2021-08-30 DIAGNOSIS — W010XXA Fall on same level from slipping, tripping and stumbling without subsequent striking against object, initial encounter: Secondary | ICD-10-CM | POA: Diagnosis present

## 2021-08-30 DIAGNOSIS — S72001A Fracture of unspecified part of neck of right femur, initial encounter for closed fracture: Secondary | ICD-10-CM | POA: Diagnosis not present

## 2021-08-30 DIAGNOSIS — Z720 Tobacco use: Secondary | ICD-10-CM | POA: Diagnosis not present

## 2021-08-30 DIAGNOSIS — R000 Tachycardia, unspecified: Secondary | ICD-10-CM | POA: Diagnosis not present

## 2021-08-30 DIAGNOSIS — M79631 Pain in right forearm: Secondary | ICD-10-CM | POA: Diagnosis not present

## 2021-08-30 DIAGNOSIS — W19XXXA Unspecified fall, initial encounter: Secondary | ICD-10-CM | POA: Diagnosis not present

## 2021-08-30 DIAGNOSIS — S79911A Unspecified injury of right hip, initial encounter: Secondary | ICD-10-CM | POA: Diagnosis not present

## 2021-08-30 DIAGNOSIS — F5104 Psychophysiologic insomnia: Secondary | ICD-10-CM | POA: Diagnosis not present

## 2021-08-30 DIAGNOSIS — Z01818 Encounter for other preprocedural examination: Secondary | ICD-10-CM | POA: Diagnosis not present

## 2021-08-30 LAB — CBC
HCT: 37.8 % (ref 36.0–46.0)
Hemoglobin: 12.3 g/dL (ref 12.0–15.0)
MCH: 31.7 pg (ref 26.0–34.0)
MCHC: 32.5 g/dL (ref 30.0–36.0)
MCV: 97.4 fL (ref 80.0–100.0)
Platelets: 248 10*3/uL (ref 150–400)
RBC: 3.88 MIL/uL (ref 3.87–5.11)
RDW: 13.1 % (ref 11.5–15.5)
WBC: 10.5 10*3/uL (ref 4.0–10.5)
nRBC: 0 % (ref 0.0–0.2)

## 2021-08-30 LAB — COMPREHENSIVE METABOLIC PANEL
ALT: 14 U/L (ref 0–44)
AST: 15 U/L (ref 15–41)
Albumin: 3.4 g/dL — ABNORMAL LOW (ref 3.5–5.0)
Alkaline Phosphatase: 55 U/L (ref 38–126)
Anion gap: 5 (ref 5–15)
BUN: 16 mg/dL (ref 8–23)
CO2: 23 mmol/L (ref 22–32)
Calcium: 8.6 mg/dL — ABNORMAL LOW (ref 8.9–10.3)
Chloride: 112 mmol/L — ABNORMAL HIGH (ref 98–111)
Creatinine, Ser: 0.66 mg/dL (ref 0.44–1.00)
GFR, Estimated: >60 mL/min (ref >60)
Glucose, Bld: 157 mg/dL — ABNORMAL HIGH (ref 70–99)
Potassium: 3.8 mmol/L (ref 3.5–5.1)
Sodium: 140 mmol/L (ref 135–145)
Total Bilirubin: 0.5 mg/dL (ref 0.3–1.2)
Total Protein: 6.2 g/dL — ABNORMAL LOW (ref 6.5–8.1)

## 2021-08-30 LAB — TYPE AND SCREEN
ABO/RH(D): O NEG
Antibody Screen: NEGATIVE

## 2021-08-30 LAB — CK: Total CK: 436 U/L — ABNORMAL HIGH (ref 38–234)

## 2021-08-30 MED ORDER — ACETAMINOPHEN 650 MG RE SUPP: 650.0000 mg | Freq: Four times a day (QID) | RECTAL | Status: DC | PRN

## 2021-08-30 MED ORDER — MORPHINE SULFATE (PF) 4 MG/ML IV SOLN
4.0000 mg | INTRAVENOUS | Status: DC | PRN
  Administered 2021-08-30: 4 mg via INTRAVENOUS
  Filled 2021-08-30: qty 1

## 2021-08-30 MED ORDER — ONDANSETRON HCL 4 MG/2ML IJ SOLN
4.0000 mg | Freq: Once | INTRAMUSCULAR | Status: AC
  Administered 2021-08-30: 4 mg via INTRAVENOUS
  Filled 2021-08-30: qty 2

## 2021-08-30 MED ORDER — ACETAMINOPHEN 325 MG PO TABS
650.0000 mg | ORAL_TABLET | Freq: Four times a day (QID) | ORAL | Status: DC | PRN
Start: 2021-08-30 — End: 2021-08-31

## 2021-08-30 MED ORDER — MORPHINE SULFATE (PF) 2 MG/ML IV SOLN: 1.0000 mg | INTRAVENOUS | Status: AC | PRN

## 2021-08-30 MED ORDER — NICOTINE 14 MG/24HR TD PT24: 14.0000 mg | MEDICATED_PATCH | Freq: Every day | TRANSDERMAL | Status: DC | PRN

## 2021-08-30 MED ORDER — POLYETHYLENE GLYCOL 3350 17 G PO PACK: 17.0000 g | PACK | Freq: Every day | ORAL | Status: DC | PRN

## 2021-08-30 MED ORDER — TRAZODONE HCL 100 MG PO TABS
100.0000 mg | ORAL_TABLET | Freq: Every day | ORAL | Status: DC
  Administered 2021-08-30 – 2021-09-03 (×5): 100 mg via ORAL
  Filled 2021-08-30 (×5): qty 1

## 2021-08-30 MED ORDER — HYDROCODONE-ACETAMINOPHEN 5-325 MG PO TABS
1.0000 | ORAL_TABLET | Freq: Four times a day (QID) | ORAL | Status: DC | PRN
  Administered 2021-08-30: 1 via ORAL
  Administered 2021-08-31 (×2): 2 via ORAL
  Filled 2021-08-30: qty 2
  Filled 2021-08-30: qty 1
  Filled 2021-08-30: qty 2

## 2021-08-30 NOTE — H&P (Signed)
History and Physical   Brittany Weeks QBH:[id number] DOB: Aug 01, 1950 DOA: 08/30/2021  PCP: Merryl Hacker, No  Outpatient Specialists: Dr. Nehemiah Massed, dermatologist Patient coming from: Home via EMS  I have personally briefly reviewed patient's old medical records in [place].  Chief Concern: Fall  HPI: Brittany Weeks is a 71 year old female with history of insomnia, presents emergency department for chief concerns of for fall.  Initial vitals in the emergency department showed temperature 98.3, respiration rate of 19, heart rate 105 and improved to 96, blood pressure 123/61, SPO2 95% on room air.  Serum sodium 140, potassium 2.8, chloride 112, bicarb 23, BUN of 16, serum creatinine of 2.66, nonfasting blood glucose 157, GFR greater than 60, WBC 10.5, hemoglobin 12.3, platelets of 248.  ED treatment: Ondansetron 4 mg IV one-time dose.  Morphine 4 mg IV.  At bedside patient is able to tell me her full name, her age, her current calendar year.  She reports that she was working on her flower bed which has a slope.  She was wearing her favorite outdoor garden pants that is very comfortable for her however she does endorse that the pants bunch up at her ankles.  She accidentally stepped on it and tripped and fell.  She denies head trauma and loss of conscious.  She states she was laying down due to the pain and yelled for about an hour for someone to come help her.  Ultimately EMS did arrive and she was taken to the ED.  She denies chest pain, shortness of breath, fever, cough, chills, nausea, vomiting.  Social history: She lives at home with her husband.  She is retired.  ROS: Constitutional: no weight change, no fever ENT/Mouth: no sore throat, no rhinorrhea Eyes: no eye pain, no vision changes Cardiovascular: no chest pain, no dyspnea,  no edema, no palpitations Respiratory: no cough, no sputum, no wheezing Gastrointestinal: no nausea, no vomiting, no diarrhea, no  constipation Genitourinary: no urinary incontinence, no dysuria, no hematuria Musculoskeletal: no arthralgias, no myalgias Skin: no skin lesions, no pruritus, Neuro: + weakness, no loss of consciousness, no syncope Psych: no anxiety, no depression, no decrease appetite Heme/Lymph: no bruising, no bleeding  ED Course: Discussed with emergency medicine provider, patient requiring hospitalization for chief concerns of right femoral fracture.  Assessment/Plan  Principal Problem:   Right femoral fracture (HCC) Active Problems:   Major depression, recurrent, full remission ([place])   Hyperlipidemia   Tobacco abuse   Insomnia   Assessment and Plan:  * Right femoral fracture (HCC) - Presumed secondary to mechanical fall in setting of pants being too long - Fall precaution - Admit to MedSurg, inpatient - Pain management: Acetaminophen 650 mg p.o. and rectal every 6 hours as needed for mild pain, fever; Norco 5-3 25, 1 to 2 tablets p.o. every 6 hours as needed for moderate pain, 5 days ordered; morphine 1 mg IV every 2 hours as needed for severe pain, 2 days ordered - Orthopedic, Dr. Sharlet Salina has been consulted and states he will see the patient  Insomnia - Resume trazodone 100 mg nightly  Tobacco abuse - As needed nicotine patch ordered  Major depression, recurrent, full remission ([place]) - With insomnia - Trazodone 100 mg nightly  DVT prophylaxis-ted hose; no pharmacolgic dvt prophylaxis ordered - AM team to initiate pharmacologic DVT prophylaxis when the benefits outweigh the risk  Chart reviewed.   DVT prophylaxis: TED hose Code Status: Full code Diet: N.p.o. pending Ortho evaluation Family Communication: No Disposition Plan:  Pending clinical course Consults called: Orthopedic, Admission status: MedSurg, inpatient  Past Medical History:  Diagnosis Date   Allergy    Anxiety    Colon polyps    Depression    Past Surgical History:  Procedure Laterality Date    COLONOSCOPY WITH PROPOFOL N/A 11/06/2019   Procedure: COLONOSCOPY WITH PROPOFOL;  Surgeon: Lesly Rubenstein, MD;  Location: [facility] ENDOSCOPY;  Service: Endoscopy;  Laterality: N/A;   GALLBLADDER SURGERY  1974   TONSILLECTOMY Bilateral 1960   Social History:  reports that she has been smoking cigarettes. She has a 50.00 pack-year smoking history. She uses smokeless tobacco. She reports current alcohol use of about 1.0 standard drink of alcohol per week. She reports that she does not use drugs.  Allergies  Allergen Reactions   Epoxytropine  [Scopolamine] Nausea Only    dizziness   Other     Other reaction(s): Cough Balsam of [place] and black rubber mix- pt states black rubber, "makes my skin split."   Nickel Rash   Family History  Problem Relation Age of Onset   Leukemia Mother    Dementia Father    Breast cancer Daughter    Family history: Family history reviewed and not pertinent.  Prior to Admission medications   Medication Sig Start Date End Date Taking? Authorizing Provider  mupirocin ointment (BACTROBAN) 2 % Apply 1 application topically daily. Qd to excision site Patient not taking: No sig reported 01/12/20   Ralene Bathe, MD  traZODone (DESYREL) 100 MG tablet Take 1 tablet (100 mg total) by mouth at bedtime. 10/12/20   Olin Hauser, DO   Physical Exam: Vitals:   08/30/21 1530 08/30/21 1600 08/30/21 1630 08/30/21 1742  BP: (!) 103/55 (!) 121/59 (!) 111/56 101/74  Pulse: 87 (!) 43 83   Resp: '18 15 19   '$ Temp:    98.6 F (37 C)  TempSrc:    Axillary  SpO2: 94% 95% 93% 97%  Weight:    70.2 kg   Constitutional: appears age-appropriate, NAD, calm, comfortable Eyes: PERRL, lids and conjunctivae normal ENMT: Mucous membranes are moist. Posterior pharynx clear of any exudate or lesions. Age-appropriate dentition. Hearing appropriate Neck: normal, supple, no masses, no thyromegaly Respiratory: clear to auscultation bilaterally, no wheezing, no crackles. Normal  respiratory effort. No accessory muscle use.  Cardiovascular: Regular rate and rhythm, no murmurs / rubs / gallops. No extremity edema. 2+ pedal pulses. No carotid bruits.  Abdomen: no tenderness, no masses palpated, no hepatosplenomegaly. Bowel sounds positive.  Musculoskeletal: no clubbing / cyanosis. No joint deformity upper and lower extremities. Good ROM, no contractures, no atrophy. Normal muscle tone.  Skin: no rashes, lesions, ulcers. No induration Neurologic: Sensation intact. Strength 5/5 in all 4.  Psychiatric: Normal judgment and insight. Alert and oriented x 3. Normal mood.   EKG: independently reviewed, showing sinus tachycardia with rate of 104, QTc 458  Chest x-ray on Admission: I personally reviewed and I agree with radiologist reading as below.  DG Chest Portable 1 View  Result Date: 08/30/2021 CLINICAL DATA:  Repeat chest radiograph to exclude pneumothorax. EXAM: PORTABLE CHEST 1 VIEW COMPARISON:  Same-day chest radiograph. FINDINGS: The cardiomediastinal silhouette is normal. There is no focal consolidation or pulmonary edema. There is no pneumothorax. The finding on the prior radiograph likely reflect a skin fold. There is no pleural effusion. The bones are stable.  No displaced rib fracture is seen. IMPRESSION: No pneumothorax. The finding on the prior radiograph likely reflected a skin fold. Electronically  Signed   By: Valetta Mole M.D.   On: 08/30/2021 15:15   DG Chest 1 View  Addendum Date: 08/30/2021   ADDENDUM REPORT: 08/30/2021 14:48 ADDENDUM: These results were called by telephone at the time of interpretation on 08/30/2021 at 2:48 pm to provider Merlyn Lot , who verbally acknowledged these results. Electronically Signed   By: Zetta Bills M.D.   On: 08/30/2021 14:48   Result Date: 08/30/2021 CLINICAL DATA:  71 year old female presenting with fall landing on RIGHT side, reported audible crack upon landing. EXAM: CHEST  1 VIEW COMPARISON:  None available.  FINDINGS: EKG leads project over the chest. Cardiomediastinal contours and hilar structures are normal. Lucency over the RIGHT upper chest towards the RIGHT lung apex. Lung markings can be seen passing peripheral to this area, this is favored to represent a skin fold. However, this cannot be followed beyond the pleural space on the frontal projection. No signs of lobar consolidation.  No evidence of pleural effusion. On limited assessment there is no acute skeletal finding. IMPRESSION: Lucency over the RIGHT upper chest towards the RIGHT lung apex. Lung markings can be seen peripherally though the area cannot be followed outside of the pleural space. While this is favored to represent a skin fold would suggest a repeat frontal view of the chest in expiration to exclude the less likely possibility of small pneumothorax on the RIGHT. Call is out to the referring provider to further discuss findings in the above case. Electronically Signed: By: Zetta Bills M.D. On: 08/30/2021 14:41   DG Hip Unilat  With Pelvis 2-3 Views Right  Result Date: 08/30/2021 CLINICAL DATA:  Fall, pain EXAM: DG HIP (WITH OR WITHOUT PELVIS) 2-3V RIGHT COMPARISON:  None Available. FINDINGS: Displaced, comminuted, impacted subtrochanteric fractures of the proximal right femur. The pelvis and proximal left femur appear intact in single frontal view. IMPRESSION: Displaced, comminuted, impacted subtrochanteric fractures of the proximal right femur. Electronically Signed   By: Delanna Ahmadi M.D.   On: 08/30/2021 14:36   DG Humerus Right  Result Date: 08/30/2021 CLINICAL DATA:  Fall, pain, initial encounter. EXAM: RIGHT HUMERUS - 2+ VIEW COMPARISON:  None. FINDINGS: No acute osseous abnormality. Degenerative changes in the right acromioclavicular joint. IMPRESSION: No acute findings. Electronically Signed   By: Lorin Picket M.D.   On: 08/30/2021 14:35   DG Forearm Right  Result Date: 08/30/2021 CLINICAL DATA:  Fall, pain, initial  encounter. EXAM: RIGHT FOREARM - 2 VIEW COMPARISON:  None. FINDINGS: No acute osseous abnormality. IMPRESSION: No acute osseous abnormality. Electronically Signed   By: Lorin Picket M.D.   On: 08/30/2021 14:34    Labs on Admission: I have personally reviewed following labs  CBC: Recent Labs  Lab 08/30/21 1332  WBC 10.5  HGB 12.3  HCT 37.8  MCV 97.4  PLT 622   Basic Metabolic Panel: Recent Labs  Lab 08/30/21 1332  NA 140  K 3.8  CL 112*  CO2 23  GLUCOSE 157*  BUN 16  CREATININE 0.66  CALCIUM 8.6*   GFR: CrCl cannot be calculated (Unknown ideal weight.).  Liver Function Tests: Recent Labs  Lab 08/30/21 1332  AST 15  ALT 14  ALKPHOS 55  BILITOT 0.5  PROT 6.2*  ALBUMIN 3.4*   Dr. Tobie Poet [facility]  If 7PM-7AM, please contact overnight-coverage provider If 7AM-7PM, please contact day coverage provider [email or web address]  08/30/2021, 7:13 PM

## 2021-08-30 NOTE — Assessment & Plan Note (Signed)
-   Resume trazodone 100 mg nightly

## 2021-08-30 NOTE — Assessment & Plan Note (Signed)
-   With insomnia - Trazodone 100 mg nightly

## 2021-08-30 NOTE — ED Triage Notes (Signed)
Per EMS Pt was working in the garden, pants were a little too long causing her to trip.  Was on the ground approximately 1.5 hours before found.  Alert and oriented. Shortening to right leg.  Pain to anterior thigh.  Declined pain meds.  18G LH.  1L NS given en route.  Sinus tech.

## 2021-08-30 NOTE — Hospital Course (Addendum)
[name] is a 71 year old female with history of insomnia, presents emergency department for chief concerns of for fall.  Initial vitals in the emergency department showed temperature 98.3, respiration rate of 19, heart rate 105 and improved to 96, blood pressure 123/61, SPO2 95% on room air.  Serum sodium 140, potassium 2.8, chloride 112, bicarb 23, BUN of 16, serum creatinine of 2.66, nonfasting blood glucose 157, GFR greater than 60, WBC 10.5, hemoglobin 12.3, platelets of 248. Right hip imaging shows displaced, cumin noted, impacted subtrochanteric fracture of the proximal right femur. Right humerus and forearm x-rays were without any acute abnormality. Chest x-ray which was initially read as pneumothorax but found to be a skin tag and labeled as negative for any significant abnormality.  ED treatment: Ondansetron 4 mg IV one-time dose.  Morphine 4 mg IV.  Orthopedic was consulted and she will be going to the OR today.  6/16: S/p ORIF on 08/31/2021.  Tolerated the procedure well. Patient was becoming little dizzy with some drop in blood pressure during changing position while working with PT.  She was feeling weak.  Hemoglobin dropped to 10.5 from 12.1 preoperatively, most likely intraoperative blood loss.  Giving some IV fluid.  Also encouraging p.o. hydration Patient still wants to go home with home health services. Orthopedic is recommending full dose aspirin twice daily for postoperative DVT prophylaxis.  6/17: Patient continued to experience dizziness with walking, orthostatic vitals were positive.  She still wants to go home instead of SNF.  Giving IV bolus PT is recommending SNF.  6/18: Hemoglobin dropped to 8.9 this morning, blood pressure remained on softer side.  Orthostatic vitals with some drop after standing, otherwise much improved.  Continued to have dizziness with ambulation, giving some more IV fluid.  Husband is requesting wheelchair and he is trying to get the  ramp to get in the house.  6/19: Hemoglobin stable at 8.8 this morning.  Continued to have positive orthostatic vitals but able to work with PT and OT today.  She was instructed to keep herself well-hydrated.  Wheelchair and 3 and 1 was ordered.  Home health services ordered as patient does not want to go to rehab place. She was also provided with some pain medications to be used only as needed.  Patient need to have a close follow-up with primary care provider and orthopedic surgeon for further recommendations.

## 2021-08-30 NOTE — ED Provider Notes (Signed)
[facility] Provider Note    Event Date/Time   First MD Initiated Contact with Patient 08/30/21 1317     (approximate)   History   Fall and Hip Pain   HPI  Brittany BARNER is a 71 y.o. female not on blood thinners presents to the ER for evaluation of right hip.  It occurred while she was working in the garden today she is wearing pants that were little bit long she tripped over one of the pant legs falling landing on her right hip.  Felt sudden crack and pop with sudden onset pain.  Was unable to get out.  Laid on the ground for more than an hour.-Finally heard her yelling.  She did not hit her head.  No LOC.  No headache.  No chest pain or pressure.  No nausea or vomiting.  Rates the pain is mild to moderate with movement, mild if she is still.     Physical Exam   Triage Vital Signs: ED Triage Vitals  Enc Vitals Group     BP 08/30/21 1326 (!) 149/57     Pulse Rate 08/30/21 1326 (!) 108     Resp 08/30/21 1326 20     Temp 08/30/21 1326 98.3 F (36.8 C)     Temp Source 08/30/21 1326 Oral     SpO2 08/30/21 1326 95 %     Weight --      Height --      Head Circumference --      Peak Flow --      Pain Score 08/30/21 1322 4     Pain Loc --      Pain Edu? --      Excl. in [place]? --     Most recent vital signs: Vitals:   08/30/21 1502 08/30/21 1517  BP:  123/67  Pulse: 96 96  Resp: (!) 21 18  Temp:    SpO2: 94% 95%     Constitutional: Alert  Eyes: Conjunctivae are normal.  Head: Atraumatic. Nose: No congestion/rhinnorhea. Mouth/Throat: Mucous membranes are moist.   Neck: Painless ROM.  Cardiovascular:   Good peripheral circulation. Respiratory: Normal respiratory effort.  No retractions.  Gastrointestinal: Soft and nontender.  Musculoskeletal: Right lower extremity is shortened as compared to left.  No rotational deformity.  Thigh compartment is soft.  Neurovascular intact distally. Neurologic:  MAE spontaneously. No gross focal neurologic  deficits are appreciated.  Skin:  Skin is warm, dry and intact. No rash noted. Psychiatric: Mood and affect are normal. Speech and behavior are normal.    ED Results / Procedures / Treatments   Labs (all labs ordered are listed, but only abnormal results are displayed) Labs Reviewed  COMPREHENSIVE METABOLIC PANEL - Abnormal; Notable for the following components:      Result Value   Chloride 112 (*)    Glucose, Bld 157 (*)    Calcium 8.6 (*)    Total Protein 6.2 (*)    Albumin 3.4 (*)    All other components within normal limits  CBC  CK  TYPE AND SCREEN     EKG  ED ECG REPORT I, Merlyn Lot, the attending physician, personally viewed and interpreted this ECG.   Date: 08/30/2021  EKG Time: 13:34  Rate: 105  Rhythm: sinus  Axis: normal  Intervals:normal  ST&T Change: no stemi, no depressions    RADIOLOGY Please see ED Course for my review and interpretation.  I personally reviewed all radiographic images ordered  to evaluate for the above acute complaints and reviewed radiology reports and findings.  These findings were personally discussed with the patient.  Please see medical record for radiology report.    PROCEDURES:  Critical Care performed: No  Procedures   MEDICATIONS ORDERED IN ED: Medications  morphine (PF) 4 MG/ML injection 4 mg (4 mg Intravenous Given 08/30/21 1516)  ondansetron (ZOFRAN) injection 4 mg (4 mg Intravenous Given 08/30/21 1516)     IMPRESSION / MDM / ASSESSMENT AND PLAN / ED COURSE  I reviewed the triage vital signs and the nursing notes.                              Differential diagnosis includes, but is not limited to, fracture, dislocation, contusion, musculoskeletal injury, rhabdo  Presented the ER for evaluation of right hip pain as described above.  Blood work as well as imaging will be ordered for the above differential.  Will order IV pain narcotic pain medication.  Anticipate she will require hospitalization as I  suspect fracture.   Clinical Course as of 08/30/21 1535  Wed Aug 30, 2021  1450 X-ray hip on my review shows evidence of displaced comminuted right subtrochanteric fracture.  Will consult Ortho. [PR]  1509 Case discussed in consultation with Dr. Sharlet Salina of Ortho. [PR]  1526 Blood work reassuring thus far.  Does appear stable and appropriate for admission to the hospital. [PR]    Clinical Course User Index [PR] Merlyn Lot, MD    Patient's presentation is most consistent with acute complicated illness / injury requiring diagnostic workup.   FINAL CLINICAL IMPRESSION(S) / ED DIAGNOSES   Final diagnoses:  Closed displaced subtrochanteric fracture of right femur, initial encounter ([place])     Rx / DC Orders   ED Discharge Orders     None        Note:  This document was prepared using Dragon voice recognition software and may include unintentional dictation errors.    Merlyn Lot, MD 08/30/21 1535

## 2021-08-30 NOTE — Assessment & Plan Note (Addendum)
-   Presumed secondary to mechanical fall in setting of pants being too long - Fall precaution - Admit to MedSurg, inpatient - Orthopedic, Dr. Sharlet Salina has been consulted and she will be going to the OR later today. -Continue with pain management -Follow-up postsurgical recommendations -PT/OT evaluation after the surgery

## 2021-08-30 NOTE — ED Notes (Signed)
Informed RN bed assigned 

## 2021-08-30 NOTE — Assessment & Plan Note (Signed)
-   As needed nicotine patch ordered ?

## 2021-08-31 ENCOUNTER — Other Ambulatory Visit: Payer: Self-pay

## 2021-08-31 ENCOUNTER — Encounter
Admission: EM | Disposition: A | Discharge status: Home Health | Payer: Self-pay | Source: Home / Self Care | Attending: Internal Medicine

## 2021-08-31 ENCOUNTER — Inpatient Hospital Stay: Payer: Medicare Other

## 2021-08-31 ENCOUNTER — Inpatient Hospital Stay: Payer: Medicare Other | Admitting: Anesthesiology

## 2021-08-31 ENCOUNTER — Encounter: Payer: Self-pay | Admitting: Internal Medicine

## 2021-08-31 DIAGNOSIS — F3342 Major depressive disorder, recurrent, in full remission: Secondary | ICD-10-CM | POA: Diagnosis not present

## 2021-08-31 DIAGNOSIS — S7221XA Displaced subtrochanteric fracture of right femur, initial encounter for closed fracture: Secondary | ICD-10-CM | POA: Diagnosis not present

## 2021-08-31 DIAGNOSIS — S7291XA Unspecified fracture of right femur, initial encounter for closed fracture: Secondary | ICD-10-CM | POA: Diagnosis not present

## 2021-08-31 DIAGNOSIS — F5104 Psychophysiologic insomnia: Secondary | ICD-10-CM | POA: Diagnosis not present

## 2021-08-31 HISTORY — PX: INTRAMEDULLARY (IM) NAIL INTERTROCHANTERIC: SHX5875

## 2021-08-31 LAB — CBC
HCT: 37.7 % (ref 36.0–46.0)
Hemoglobin: 12.1 g/dL (ref 12.0–15.0)
MCH: 31.5 pg (ref 26.0–34.0)
MCHC: 32.1 g/dL (ref 30.0–36.0)
MCV: 98.2 fL (ref 80.0–100.0)
Platelets: 248 10*3/uL (ref 150–400)
RBC: 3.84 MIL/uL — ABNORMAL LOW (ref 3.87–5.11)
RDW: 13.1 % (ref 11.5–15.5)
WBC: 11 10*3/uL — ABNORMAL HIGH (ref 4.0–10.5)
nRBC: 0 % (ref 0.0–0.2)

## 2021-08-31 LAB — BASIC METABOLIC PANEL
Anion gap: 4 — ABNORMAL LOW (ref 5–15)
BUN: 11 mg/dL (ref 8–23)
CO2: 26 mmol/L (ref 22–32)
Calcium: 8.8 mg/dL — ABNORMAL LOW (ref 8.9–10.3)
Chloride: 110 mmol/L (ref 98–111)
Creatinine, Ser: 0.59 mg/dL (ref 0.44–1.00)
GFR, Estimated: >60 mL/min (ref >60)
Glucose, Bld: 141 mg/dL — ABNORMAL HIGH (ref 70–99)
Potassium: 4 mmol/L (ref 3.5–5.1)
Sodium: 140 mmol/L (ref 135–145)

## 2021-08-31 SURGERY — FIXATION, FRACTURE, INTERTROCHANTERIC, WITH INTRAMEDULLARY ROD
Anesthesia: General | Site: Hip | Laterality: Right

## 2021-08-31 MED ORDER — METOCLOPRAMIDE HCL 5 MG PO TABS: 5.0000 mg | ORAL_TABLET | Freq: Three times a day (TID) | ORAL | Status: DC | PRN

## 2021-08-31 MED ORDER — KETAMINE HCL 10 MG/ML IJ SOLN
INTRAMUSCULAR | Status: DC | PRN
  Administered 2021-08-31: 20 mg via INTRAVENOUS
  Administered 2021-08-31: 30 mg via INTRAVENOUS

## 2021-08-31 MED ORDER — FENTANYL CITRATE (PF) 100 MCG/2ML IJ SOLN: 25.0000 ug | INTRAMUSCULAR | Status: DC | PRN

## 2021-08-31 MED ORDER — CEFAZOLIN SODIUM-DEXTROSE 2-4 GM/100ML-% IV SOLN
2.0000 g | Freq: Three times a day (TID) | INTRAVENOUS | Status: AC
  Administered 2021-08-31 – 2021-09-01 (×2): 2 g via INTRAVENOUS
  Filled 2021-08-31 (×2): qty 100

## 2021-08-31 MED ORDER — ACETAMINOPHEN 10 MG/ML IV SOLN
INTRAVENOUS | Status: AC
Start: 2021-08-31 — End: ?
  Filled 2021-08-31: qty 100

## 2021-08-31 MED ORDER — EPHEDRINE SULFATE (PRESSORS) 50 MG/ML IJ SOLN
INTRAMUSCULAR | Status: DC | PRN
  Administered 2021-08-31: 5 mg via INTRAVENOUS
  Administered 2021-08-31: 10 mg via INTRAVENOUS
  Administered 2021-08-31 (×2): 5 mg via INTRAVENOUS

## 2021-08-31 MED ORDER — PROMETHAZINE HCL 25 MG/ML IJ SOLN: 6.2500 mg | INTRAMUSCULAR | Status: DC | PRN

## 2021-08-31 MED ORDER — LIDOCAINE HCL (CARDIAC) PF 100 MG/5ML IV SOSY
PREFILLED_SYRINGE | INTRAVENOUS | Status: DC | PRN
  Administered 2021-08-31: 100 mg via INTRAVENOUS

## 2021-08-31 MED ORDER — ACETAMINOPHEN 500 MG PO TABS
500.0000 mg | ORAL_TABLET | Freq: Four times a day (QID) | ORAL | Status: AC
  Administered 2021-08-31 – 2021-09-01 (×4): 500 mg via ORAL
  Filled 2021-08-31 (×5): qty 1

## 2021-08-31 MED ORDER — PROPOFOL 1000 MG/100ML IV EMUL
INTRAVENOUS | Status: AC
  Filled 2021-08-31: qty 100

## 2021-08-31 MED ORDER — DEXAMETHASONE SODIUM PHOSPHATE 10 MG/ML IJ SOLN
INTRAMUSCULAR | Status: AC
  Filled 2021-08-31: qty 1

## 2021-08-31 MED ORDER — DOCUSATE SODIUM 100 MG PO CAPS
100.0000 mg | ORAL_CAPSULE | Freq: Two times a day (BID) | ORAL | Status: DC
  Administered 2021-08-31 – 2021-09-03 (×6): 100 mg via ORAL
  Filled 2021-08-31 (×8): qty 1

## 2021-08-31 MED ORDER — PHENYLEPHRINE 80 MCG/ML (10ML) SYRINGE FOR IV PUSH (FOR BLOOD PRESSURE SUPPORT)
PREFILLED_SYRINGE | INTRAVENOUS | Status: AC
  Filled 2021-08-31: qty 10

## 2021-08-31 MED ORDER — PHENOL 1.4 % MT LIQD
1.0000 | OROMUCOSAL | Status: DC | PRN
Start: 2021-08-31 — End: 2021-09-04

## 2021-08-31 MED ORDER — LIDOCAINE HCL (PF) 1 % IJ SOLN
INTRAMUSCULAR | Status: AC
  Filled 2021-08-31: qty 60

## 2021-08-31 MED ORDER — ONDANSETRON HCL 4 MG/2ML IJ SOLN
INTRAMUSCULAR | Status: AC
  Filled 2021-08-31: qty 2

## 2021-08-31 MED ORDER — EPHEDRINE 5 MG/ML INJ
INTRAVENOUS | Status: AC
  Filled 2021-08-31: qty 5

## 2021-08-31 MED ORDER — ACETAMINOPHEN 10 MG/ML IV SOLN: 1000.0000 mg | Freq: Once | INTRAVENOUS | Status: DC | PRN

## 2021-08-31 MED ORDER — MENTHOL 3 MG MT LOZG: 1.0000 | LOZENGE | OROMUCOSAL | Status: DC | PRN

## 2021-08-31 MED ORDER — ONDANSETRON HCL 4 MG PO TABS: 4.0000 mg | ORAL_TABLET | Freq: Four times a day (QID) | ORAL | Status: DC | PRN

## 2021-08-31 MED ORDER — CEFAZOLIN SODIUM-DEXTROSE 2-4 GM/100ML-% IV SOLN
2.0000 g | INTRAVENOUS | Status: AC
Start: 2021-08-31 — End: 2021-08-31
  Administered 2021-08-31: 2 g via INTRAVENOUS
  Filled 2021-08-31: qty 100

## 2021-08-31 MED ORDER — OXYCODONE HCL 5 MG/5ML PO SOLN: 5.0000 mg | Freq: Once | ORAL | Status: DC | PRN

## 2021-08-31 MED ORDER — FENTANYL CITRATE (PF) 100 MCG/2ML IJ SOLN
INTRAMUSCULAR | Status: AC
  Filled 2021-08-31: qty 2

## 2021-08-31 MED ORDER — DROPERIDOL 2.5 MG/ML IJ SOLN: 0.6250 mg | Freq: Once | INTRAMUSCULAR | Status: DC | PRN

## 2021-08-31 MED ORDER — 0.9 % SODIUM CHLORIDE (POUR BTL) OPTIME
TOPICAL | Status: DC | PRN
  Administered 2021-08-31: 1000 mL

## 2021-08-31 MED ORDER — BUPIVACAINE-EPINEPHRINE (PF) 0.5% -1:200000 IJ SOLN
INTRAMUSCULAR | Status: AC
  Filled 2021-08-31: qty 60

## 2021-08-31 MED ORDER — KETAMINE HCL 50 MG/5ML IJ SOSY
PREFILLED_SYRINGE | INTRAMUSCULAR | Status: AC
  Filled 2021-08-31: qty 5

## 2021-08-31 MED ORDER — DEXAMETHASONE SODIUM PHOSPHATE 10 MG/ML IJ SOLN
INTRAMUSCULAR | Status: DC | PRN
  Administered 2021-08-31: 5 mg via INTRAVENOUS

## 2021-08-31 MED ORDER — ROCURONIUM BROMIDE 100 MG/10ML IV SOLN
INTRAVENOUS | Status: DC | PRN
  Administered 2021-08-31: 10 mg via INTRAVENOUS
  Administered 2021-08-31: 60 mg via INTRAVENOUS

## 2021-08-31 MED ORDER — LIDOCAINE HCL (PF) 2 % IJ SOLN
INTRAMUSCULAR | Status: AC
  Filled 2021-08-31: qty 5

## 2021-08-31 MED ORDER — ACETAMINOPHEN 325 MG PO TABS: 325.0000 mg | ORAL_TABLET | Freq: Four times a day (QID) | ORAL | Status: DC | PRN

## 2021-08-31 MED ORDER — SUGAMMADEX SODIUM 200 MG/2ML IV SOLN
INTRAVENOUS | Status: DC | PRN
  Administered 2021-08-31: 200 mg via INTRAVENOUS

## 2021-08-31 MED ORDER — BUPIVACAINE-EPINEPHRINE 0.5% -1:200000 IJ SOLN
INTRAMUSCULAR | Status: DC | PRN
  Administered 2021-08-31: 30 mL

## 2021-08-31 MED ORDER — ROCURONIUM BROMIDE 10 MG/ML (PF) SYRINGE
PREFILLED_SYRINGE | INTRAVENOUS | Status: AC
  Filled 2021-08-31: qty 10

## 2021-08-31 MED ORDER — HYDROCODONE-ACETAMINOPHEN 5-325 MG PO TABS
1.0000 | ORAL_TABLET | ORAL | Status: DC | PRN
  Administered 2021-09-01 – 2021-09-02 (×3): 2 via ORAL
  Administered 2021-09-04 (×2): 1 via ORAL
  Filled 2021-08-31: qty 1
  Filled 2021-08-31 (×2): qty 2
  Filled 2021-08-31: qty 1
  Filled 2021-08-31: qty 2

## 2021-08-31 MED ORDER — MORPHINE SULFATE (PF) 2 MG/ML IV SOLN: 0.5000 mg | INTRAVENOUS | Status: DC | PRN

## 2021-08-31 MED ORDER — ONDANSETRON HCL 4 MG/2ML IJ SOLN
4.0000 mg | Freq: Four times a day (QID) | INTRAMUSCULAR | Status: DC | PRN
  Administered 2021-09-01: 4 mg via INTRAVENOUS
  Filled 2021-08-31: qty 2

## 2021-08-31 MED ORDER — TRANEXAMIC ACID-NACL 1000-0.7 MG/100ML-% IV SOLN
INTRAVENOUS | Status: AC
  Administered 2021-08-31: 1000 mg via INTRAVENOUS
  Filled 2021-08-31: qty 100

## 2021-08-31 MED ORDER — ONDANSETRON HCL 4 MG/2ML IJ SOLN
INTRAMUSCULAR | Status: DC | PRN
  Administered 2021-08-31: 4 mg via INTRAVENOUS

## 2021-08-31 MED ORDER — METOCLOPRAMIDE HCL 5 MG/ML IJ SOLN: 5.0000 mg | Freq: Three times a day (TID) | INTRAMUSCULAR | Status: DC | PRN

## 2021-08-31 MED ORDER — PHENYLEPHRINE HCL (PRESSORS) 10 MG/ML IV SOLN
INTRAVENOUS | Status: DC | PRN
  Administered 2021-08-31 (×4): 160 ug via INTRAVENOUS
  Administered 2021-08-31: 80 ug via INTRAVENOUS
  Administered 2021-08-31 (×2): 160 ug via INTRAVENOUS

## 2021-08-31 MED ORDER — HYDROCODONE-ACETAMINOPHEN 7.5-325 MG PO TABS
1.0000 | ORAL_TABLET | ORAL | Status: DC | PRN
  Administered 2021-09-03: 1 via ORAL
  Filled 2021-08-31: qty 1

## 2021-08-31 MED ORDER — LACTATED RINGERS IV SOLN: INTRAVENOUS | Status: DC | PRN

## 2021-08-31 MED ORDER — ASPIRIN 325 MG PO TBEC
325.0000 mg | DELAYED_RELEASE_TABLET | Freq: Two times a day (BID) | ORAL | Status: DC
  Administered 2021-09-01 – 2021-09-04 (×7): 325 mg via ORAL
  Filled 2021-08-31 (×7): qty 1

## 2021-08-31 MED ORDER — TRANEXAMIC ACID-NACL 1000-0.7 MG/100ML-% IV SOLN
1000.0000 mg | Freq: Once | INTRAVENOUS | Status: AC
Start: 2021-08-31 — End: 2021-08-31

## 2021-08-31 MED ORDER — FENTANYL CITRATE (PF) 100 MCG/2ML IJ SOLN
INTRAMUSCULAR | Status: DC | PRN
  Administered 2021-08-31: 50 ug via INTRAVENOUS

## 2021-08-31 MED ORDER — ACETAMINOPHEN 10 MG/ML IV SOLN
INTRAVENOUS | Status: DC | PRN
  Administered 2021-08-31: 1000 mg via INTRAVENOUS

## 2021-08-31 MED ORDER — PROPOFOL 10 MG/ML IV BOLUS
INTRAVENOUS | Status: DC | PRN
  Administered 2021-08-31: 40 mg via INTRAVENOUS
  Administered 2021-08-31: 120 mg via INTRAVENOUS

## 2021-08-31 MED ORDER — OXYCODONE HCL 5 MG PO TABS: 5.0000 mg | ORAL_TABLET | Freq: Once | ORAL | Status: DC | PRN

## 2021-08-31 SURGICAL SUPPLY — 49 items
BIT DRILL INTERTAN LAG SCREW (BIT) ×1 IMPLANT
BIT DRILL SHORT 4.0 (BIT) IMPLANT
BNDG COHESIVE 6X5 TAN ST LF (GAUZE/BANDAGES/DRESSINGS) ×6 IMPLANT
CHLORAPREP W/TINT 26 (MISCELLANEOUS) ×2 IMPLANT
DRAPE 3/4 80X56 (DRAPES) ×4 IMPLANT
DRAPE C-ARM 42X72 X-RAY (DRAPES) ×2 IMPLANT
DRAPE SURG 17X11 SM STRL (DRAPES) ×4 IMPLANT
DRAPE U-SHAPE 47X51 STRL (DRAPES) ×2 IMPLANT
DRILL BIT SHORT 4.0 (BIT) ×4
DRSG OPSITE POSTOP 3X4 (GAUZE/BANDAGES/DRESSINGS) ×3 IMPLANT
DRSG OPSITE POSTOP 4X6 (GAUZE/BANDAGES/DRESSINGS) ×4 IMPLANT
ELECT REM PT RETURN 9FT ADLT (ELECTROSURGICAL) ×2
ELECTRODE REM PT RTRN 9FT ADLT (ELECTROSURGICAL) ×1 IMPLANT
GAUZE SPONGE 4X4 12PLY STRL (GAUZE/BANDAGES/DRESSINGS) ×2 IMPLANT
GAUZE XEROFORM 1X8 LF (GAUZE/BANDAGES/DRESSINGS) ×3 IMPLANT
GLOVE BIO SURGEON STRL SZ7.5 (GLOVE) ×2 IMPLANT
GLOVE SURG UNDER POLY LF SZ7.5 (GLOVE) ×2 IMPLANT
GOWN STRL REUS W/ TWL XL LVL3 (GOWN DISPOSABLE) ×1 IMPLANT
GOWN STRL REUS W/TWL XL LVL3 (GOWN DISPOSABLE) ×2
GOWN STRL REUS W/TWL XL LVL4 (GOWN DISPOSABLE) ×2 IMPLANT
GUIDE PIN 3.2X343 (PIN) ×2
GUIDE PIN 3.2X343MM (PIN) ×4
GUIDE ROD 3.0 (MISCELLANEOUS) ×2
HOLDER FOLEY CATH W/STRAP (MISCELLANEOUS) ×2 IMPLANT
KIT TURNOVER CYSTO (KITS) ×2 IMPLANT
MANIFOLD NEPTUNE II (INSTRUMENTS) ×2 IMPLANT
MAT ABSORB  FLUID 56X50 GRAY (MISCELLANEOUS) ×2
MAT ABSORB FLUID 56X50 GRAY (MISCELLANEOUS) ×1 IMPLANT
NAIL TRIGEN 10MMX36CM-125 RT (Nail) ×1 IMPLANT
NS IRRIG 1000ML POUR BTL (IV SOLUTION) ×2 IMPLANT
PACK HIP COMPR (MISCELLANEOUS) ×2 IMPLANT
PAD ABD DERMACEA PRESS 5X9 (GAUZE/BANDAGES/DRESSINGS) ×2 IMPLANT
PAD ARMBOARD 7.5X6 YLW CONV (MISCELLANEOUS) ×2 IMPLANT
PIN GUIDE 3.2X343MM (PIN) IMPLANT
ROD GUIDE 3.0 (MISCELLANEOUS) IMPLANT
SCREW LAG COMPR KIT 75/70 (Screw) ×1 IMPLANT
SCREW TRIGEN LOW PROF 5.0X32.5 (Screw) ×1 IMPLANT
SCREW TRIGEN LOW PROF 5.0X35 (Screw) ×1 IMPLANT
STAPLER SKIN PROX 35W (STAPLE) ×2 IMPLANT
SUCTION FRAZIER HANDLE 10FR (MISCELLANEOUS) ×2
SUCTION TUBE FRAZIER 10FR DISP (MISCELLANEOUS) ×1 IMPLANT
SUT VIC AB 0 CT1 36 (SUTURE) ×4 IMPLANT
SUT VIC AB 2-0 CT1 (SUTURE) ×2 IMPLANT
SUT VIC AB 2-0 CT1 27 (SUTURE) ×2
SUT VIC AB 2-0 CT1 TAPERPNT 27 (SUTURE) ×1 IMPLANT
SYR 30ML LL (SYRINGE) ×2 IMPLANT
TAPE PAPER 1/2X10 TAN MEDIPORE (MISCELLANEOUS) ×2 IMPLANT
TRAY FOLEY SLVR 16FR LF STAT (SET/KITS/TRAYS/PACK) ×2 IMPLANT
WATER STERILE IRR 500ML POUR (IV SOLUTION) ×2 IMPLANT

## 2021-08-31 NOTE — Assessment & Plan Note (Signed)
Not on any meds at home

## 2021-08-31 NOTE — Progress Notes (Signed)
[id number] Consent signed and placed in chart.  1406 Pt being transported to PRE op at this time

## 2021-08-31 NOTE — Plan of Care (Signed)
  Problem: Coping: Goal: Level of anxiety will decrease Outcome: Progressing   Problem: Pain Managment: Goal: General experience of comfort will improve Outcome: Progressing   Problem: Safety: Goal: Ability to remain free from injury will improve Outcome: Progressing   

## 2021-08-31 NOTE — Anesthesia Procedure Notes (Signed)
Procedure Name: Intubation Date/Time: 08/31/2021 3:48 PM  Performed by: Lia Foyer, CRNAPre-anesthesia Checklist: Patient identified, Emergency Drugs available, Suction available and Patient being monitored Patient Re-evaluated:Patient Re-evaluated prior to induction Oxygen Delivery Method: Circle system utilized Preoxygenation: Pre-oxygenation with 100% oxygen Induction Type: IV induction Ventilation: Mask ventilation without difficulty Laryngoscope Size: McGraph and 3 Grade View: Grade I Tube type: Oral Tube size: 7.0 mm Number of attempts: 1 Airway Equipment and Method: Stylet and Video-laryngoscopy Placement Confirmation: ETT inserted through vocal cords under direct vision, positive ETCO2 and breath sounds checked- equal and bilateral Secured at: 21 cm Tube secured with: Tape Dental Injury: Teeth and Oropharynx as per pre-operative assessment

## 2021-08-31 NOTE — Anesthesia Preprocedure Evaluation (Signed)
Anesthesia Evaluation  Patient identified by MRN, date of birth, ID band Patient awake    Reviewed: Allergy & Precautions, NPO status , Patient's Chart, lab work & pertinent test results  History of Anesthesia Complications Negative for: history of anesthetic complications  Airway Mallampati: III  TM Distance: >3 FB Neck ROM: full    Dental  (+) Chipped, Poor Dentition, Missing   Pulmonary neg shortness of breath, COPD, Current Smoker and Patient abstained from smoking.,    Pulmonary exam normal        Cardiovascular Exercise Tolerance: Good (-) angina(-) Past MI negative cardio ROS Normal cardiovascular exam     Neuro/Psych PSYCHIATRIC DISORDERS negative neurological ROS     GI/Hepatic negative GI ROS, Neg liver ROS, neg GERD  ,  Endo/Other  negative endocrine ROS  Renal/GU      Musculoskeletal   Abdominal   Peds  Hematology negative hematology ROS (+)   Anesthesia Other Findings Past Medical History: No date: Allergy No date: Anxiety No date: Colon polyps No date: Depression  Past Surgical History: 11/06/2019: COLONOSCOPY WITH PROPOFOL; N/A     Comment:  Procedure: COLONOSCOPY WITH PROPOFOL;  Surgeon:               Lesly Rubenstein, MD;  Location: [facility] ENDOSCOPY;                Service: Endoscopy;  Laterality: N/A; 1974: GALLBLADDER SURGERY 1960: TONSILLECTOMY; Bilateral  BMI    Body Mass Index: 27.42 kg/m      Reproductive/Obstetrics negative OB ROS                             Anesthesia Physical Anesthesia Plan  ASA: 3  Anesthesia Plan: General ETT   Post-op Pain Management:    Induction: Intravenous  PONV Risk Score and Plan: Ondansetron, Dexamethasone, Midazolam and Treatment may vary due to age or medical condition  Airway Management Planned: Oral ETT  Additional Equipment:   Intra-op Plan:   Post-operative Plan: Extubation in OR  Informed Consent:  I have reviewed the patients History and Physical, chart, labs and discussed the procedure including the risks, benefits and alternatives for the proposed anesthesia with the patient or authorized representative who has indicated his/her understanding and acceptance.     Dental Advisory Given  Plan Discussed with: Anesthesiologist, CRNA and Surgeon  Anesthesia Plan Comments: (Patient consented for risks of anesthesia including but not limited to:  - adverse reactions to medications - damage to eyes, teeth, lips or other oral mucosa - nerve damage due to positioning  - sore throat or hoarseness - Damage to heart, brain, nerves, lungs, other parts of body or loss of life  Patient voiced understanding.)        Anesthesia Quick Evaluation

## 2021-08-31 NOTE — Progress Notes (Signed)
  Progress Note   Patient: Brittany Weeks XUX:[id number] DOB: 1950/11/12 DOA: 08/30/2021     1 DOS: the patient was seen and examined on 08/31/2021   Brief hospital course: [name] is a 71 year old female with history of insomnia, presents emergency department for chief concerns of for fall.  Initial vitals in the emergency department showed temperature 98.3, respiration rate of 19, heart rate 105 and improved to 96, blood pressure 123/61, SPO2 95% on room air.  Serum sodium 140, potassium 2.8, chloride 112, bicarb 23, BUN of 16, serum creatinine of 2.66, nonfasting blood glucose 157, GFR greater than 60, WBC 10.5, hemoglobin 12.3, platelets of 248. Right hip imaging shows displaced, cumin noted, impacted subtrochanteric fracture of the proximal right femur. Right humerus and forearm x-rays were without any acute abnormality. Chest x-ray which was initially read as pneumothorax but found to be a skin tag and labeled as negative for any significant abnormality.  ED treatment: Ondansetron 4 mg IV one-time dose.  Morphine 4 mg IV.  Orthopedic was consulted and she will be going to the OR today.   Assessment and Plan: * Right femoral fracture ([place]) - Presumed secondary to mechanical fall in setting of pants being too long - Fall precaution - Admit to MedSurg, inpatient - Orthopedic, Dr. Sharlet Salina has been consulted and she will be going to the OR later today. -Continue with pain management -Follow-up postsurgical recommendations -PT/OT evaluation after the surgery  Major depression, recurrent, full remission ([place]) - With insomnia - Trazodone 100 mg nightly  Hyperlipidemia-resolved as of 08/31/2021 Not on any meds at home  Tobacco abuse - As needed nicotine patch ordered  Insomnia - Resume trazodone 100 mg nightly   Subjective: Patient was having 6/10 pain when seen today.  Waiting for surgery.  Husband at bedside.  She used to take Fosamax many years ago.  Physical  Exam: Vitals:   08/31/21 0725 08/31/21 0823 08/31/21 1113 08/31/21 1441  BP: 123/72 106/62 121/64 (!) 122/59  Pulse: 70 72 77   Resp: '15 18 16 20  '$ Temp: 98 F (36.7 C) 97.8 F (36.6 C) (!) 97.5 F (36.4 C) 98.8 F (37.1 C)  TempSrc:    Oral  SpO2: 95% 97% 95% 97%  Weight:       General.  Frail elderly lady, in in no acute distress. Pulmonary.  Lungs clear bilaterally, normal respiratory effort. CV.  Regular rate and rhythm, no JVD, rub or murmur. Abdomen.  Soft, nontender, nondistended, BS positive. CNS.  Alert and oriented .  No focal neurologic deficit. Extremities.  No edema, no cyanosis, pulses intact and symmetrical. Psychiatry.  Judgment and insight appears normal.  Data Reviewed: Prior notes, labs and images reviewed  Family Communication: Discussed with husband at bedside  Disposition: Status is: Inpatient Remains inpatient appropriate because: Severity of illness   Planned Discharge Destination: Home with Home Health  Time spent: 45 minutes  This record has been created using Systems analyst. Errors have been sought and corrected,but may not always be located. Such creation errors do not reflect on the standard of care.  Author: Lorella Nimrod, MD 08/31/2021 3:29 PM  For on call review [email or web address].

## 2021-08-31 NOTE — Anesthesia Postprocedure Evaluation (Signed)
Anesthesia Post Note  Patient: Brittany Weeks  Procedure(s) Performed: INTRAMEDULLARY (IM) NAIL INTERTROCHANTRIC (Right: Hip)  Patient location during evaluation: PACU Anesthesia Type: General Level of consciousness: awake and alert Pain management: pain level controlled Vital Signs Assessment: post-procedure vital signs reviewed and stable Respiratory status: spontaneous breathing, nonlabored ventilation and respiratory function stable Cardiovascular status: blood pressure returned to baseline and stable Postop Assessment: no apparent nausea or vomiting Anesthetic complications: no   No notable events documented.   Last Vitals:  Vitals:   08/31/21 1900 08/31/21 1922  BP: 116/60 113/64  Pulse: 79 79  Resp: 14 16  Temp:  36.4 C  SpO2: 98% 96%    Last Pain:  Vitals:   08/31/21 1922  TempSrc:   PainSc: 0-No pain                 [place] Ouch

## 2021-08-31 NOTE — Plan of Care (Signed)

## 2021-08-31 NOTE — Op Note (Signed)
DATE OF SURGERY:  08/31/2021  TIME: 6:28 PM  PATIENT NAME:  Brittany Weeks  AGE: 71 y.o.  PRE-OPERATIVE DIAGNOSIS: Right subtrochanteric femur fracture  POST-OPERATIVE DIAGNOSIS:  SAME  PROCEDURE: Right femur INTRAMEDULLARY (IM) NAIL INTERTROCHANTRIC  SURGEON:  Renee Harder  EBL:  973 cc  COMPLICATIONS: None  OPERATIVE IMPLANTS: [employer] Intertan femoral nail 360 mm x 10 mm  PREOPERATIVE INDICATIONS:  [name] is a 71 y.o. year old who fell and suffered a hip fracture. She was brought into the ER and then admitted and optimized and then elected for surgical intervention.    The risks benefits and alternatives were discussed with the patient including but not limited to the risks of nonoperative treatment, versus surgical intervention including infection, bleeding, nerve injury, malunion, nonunion, hardware prominence, hardware failure, need for hardware removal, blood clots, cardiopulmonary complications, morbidity, mortality, among others, and they were willing to proceed.    OPERATIVE PROCEDURE:  The patient was brought to the operating room and placed in the supine position.  General anesthesia was administered, with a foley. She was placed on the fracture table.  Closed reduction was performed under C-arm guidance. The length of the femur was also measured using fluoroscopy. Time out was then performed after sterile prep and drape. She received preoperative antibiotics.  Incision was made proximal to the greater trochanter. A guidewire was placed in the appropriate position. Confirmation was made on AP and lateral views. The above-named nail was opened. I opened the proximal femur with a reamer.  I then placed a long ball-tipped guidewire down the length of the femur.  Lateral incision was made at the fracture site.  Reduction clamps were utilized to help provide provisional reduction given the subtrochanteric nature of the fracture with the proximal fragment in  flexion and abduction.  Guidewire was overreamed to 11.5 mm.  Fracture remained reduced during reaming.  I then placed the nail by hand easily down.  Once the nail was completely seated, I placed a guidepin into the femoral head into the central, posterior aspect of the femoral head.  I measured the length, and then reamed the lateral cortex and up into the head. I then placed the two interlocking lag screws. Slight compression was applied. Anatomic fixation achieved. Bone quality was poor.  I then secured the proximal interlocking screws.  2 distal interlocking screws were placed with perfect circle technique.  I then removed the instruments, and took final C-arm pictures AP and lateral the entire length of the leg. Anatomic reconstruction was achieved, and the wounds were irrigated copiously and closed with Vicryl  followed by staples and dry sterile dressing. Sponge and needle count were correct.   The patient was awakened and returned to PACU in stable and satisfactory condition. There no complications and the patient tolerated the procedure well.   POSTOPERATIVE PLAN: She will be weightbearing as tolerated.   Ok to start DVT ppx POD#1 -aspirin 325 mg twice daily Dressing change by nursing staff as needed to keep dressing clean and dry Outpatient f/u in clinic in 2 weeks for staple removal and xrays  Renee Harder

## 2021-08-31 NOTE — Consult Note (Signed)
ORTHOPAEDIC CONSULTATION  REQUESTING PHYSICIAN: Lorella Nimrod, MD  Chief Complaint: Right hip pain  HPI: Brittany Weeks is a 71 y.o. female who complains of right hip pain after mechanical fall in her garden. The pain is sharp in character. The pain is worse with movement and better with rest. Denies any numbness, tingling or constitutional symptoms.  X-rays were obtained in the ER which shows presence of a displaced right subtrochanteric hip fracture.  Patient was admitted to the hospitalist service and orthopedics was consulted regarding further management.  Patient is not on any anticoagulation and does not walk with any assistive ambulatory device.  Past Medical History:  Diagnosis Date   Allergy    Anxiety    Colon polyps    Depression    Past Surgical History:  Procedure Laterality Date   COLONOSCOPY WITH PROPOFOL N/A 11/06/2019   Procedure: COLONOSCOPY WITH PROPOFOL;  Surgeon: Lesly Rubenstein, MD;  Location: [facility] ENDOSCOPY;  Service: Endoscopy;  Laterality: N/A;   GALLBLADDER SURGERY  1974   TONSILLECTOMY Bilateral 1960   Social History   Socioeconomic History   Marital status: Married    Spouse name: Not on file   Number of children: Not on file   Years of education: High School   Highest education level: High school graduate  Occupational History   Not on file  Tobacco Use   Smoking status: Every Day    Packs/day: 1.00    Years: 50.00    Total pack years: 50.00    Types: Cigarettes   Smokeless tobacco: Current  Vaping Use   Vaping Use: Never used  Substance and Sexual Activity   Alcohol use: Yes    Alcohol/week: 1.0 standard drink of alcohol    Types: 1 Standard drinks or equivalent per week   Drug use: Never   Sexual activity: Not on file  Other Topics Concern   Not on file  Social History Narrative   Not on file   Social Determinants of Health   Financial Resource Strain: Not on file  Food Insecurity: Not on file  Transportation Needs: Not  on file  Physical Activity: Not on file  Stress: Not on file  Social Connections: Not on file   Family History  Problem Relation Age of Onset   Leukemia Mother    Dementia Father    Breast cancer Daughter    Allergies  Allergen Reactions   Epoxytropine  [Scopolamine] Nausea Only    dizziness   Other     Other reaction(s): Cough Balsam of [place] and black rubber mix- pt states black rubber, "makes my skin split."   Nickel Rash   Prior to Admission medications   Medication Sig Start Date End Date Taking? Authorizing Provider  traZODone (DESYREL) 100 MG tablet Take 1 tablet (100 mg total) by mouth at bedtime. 10/12/20  Yes Karamalegos, Devonne Doughty, DO  mupirocin ointment (BACTROBAN) 2 % Apply 1 application topically daily. Qd to excision site Patient not taking: Reported on 01/19/2020 01/12/20   Ralene Bathe, MD   DG Chest Portable 1 View  Result Date: 08/30/2021 CLINICAL DATA:  Repeat chest radiograph to exclude pneumothorax. EXAM: PORTABLE CHEST 1 VIEW COMPARISON:  Same-day chest radiograph. FINDINGS: The cardiomediastinal silhouette is normal. There is no focal consolidation or pulmonary edema. There is no pneumothorax. The finding on the prior radiograph likely reflect a skin fold. There is no pleural effusion. The bones are stable.  No displaced rib fracture is seen. IMPRESSION: No pneumothorax. The  finding on the prior radiograph likely reflected a skin fold. Electronically Signed   By: Valetta Mole M.D.   On: 08/30/2021 15:15   DG Chest 1 View  Addendum Date: 08/30/2021   ADDENDUM REPORT: 08/30/2021 14:48 ADDENDUM: These results were called by telephone at the time of interpretation on 08/30/2021 at 2:48 pm to provider Merlyn Lot , who verbally acknowledged these results. Electronically Signed   By: Zetta Bills M.D.   On: 08/30/2021 14:48   Result Date: 08/30/2021 CLINICAL DATA:  71 year old female presenting with fall landing on RIGHT side, reported audible crack upon  landing. EXAM: CHEST  1 VIEW COMPARISON:  None available. FINDINGS: EKG leads project over the chest. Cardiomediastinal contours and hilar structures are normal. Lucency over the RIGHT upper chest towards the RIGHT lung apex. Lung markings can be seen passing peripheral to this area, this is favored to represent a skin fold. However, this cannot be followed beyond the pleural space on the frontal projection. No signs of lobar consolidation.  No evidence of pleural effusion. On limited assessment there is no acute skeletal finding. IMPRESSION: Lucency over the RIGHT upper chest towards the RIGHT lung apex. Lung markings can be seen peripherally though the area cannot be followed outside of the pleural space. While this is favored to represent a skin fold would suggest a repeat frontal view of the chest in expiration to exclude the less likely possibility of small pneumothorax on the RIGHT. Call is out to the referring provider to further discuss findings in the above case. Electronically Signed: By: Zetta Bills M.D. On: 08/30/2021 14:41   DG Hip Unilat  With Pelvis 2-3 Views Right  Result Date: 08/30/2021 CLINICAL DATA:  Fall, pain EXAM: DG HIP (WITH OR WITHOUT PELVIS) 2-3V RIGHT COMPARISON:  None Available. FINDINGS: Displaced, comminuted, impacted subtrochanteric fractures of the proximal right femur. The pelvis and proximal left femur appear intact in single frontal view. IMPRESSION: Displaced, comminuted, impacted subtrochanteric fractures of the proximal right femur. Electronically Signed   By: Delanna Ahmadi M.D.   On: 08/30/2021 14:36   DG Humerus Right  Result Date: 08/30/2021 CLINICAL DATA:  Fall, pain, initial encounter. EXAM: RIGHT HUMERUS - 2+ VIEW COMPARISON:  None. FINDINGS: No acute osseous abnormality. Degenerative changes in the right acromioclavicular joint. IMPRESSION: No acute findings. Electronically Signed   By: Lorin Picket M.D.   On: 08/30/2021 14:35   DG Forearm Right  Result  Date: 08/30/2021 CLINICAL DATA:  Fall, pain, initial encounter. EXAM: RIGHT FOREARM - 2 VIEW COMPARISON:  None. FINDINGS: No acute osseous abnormality. IMPRESSION: No acute osseous abnormality. Electronically Signed   By: Lorin Picket M.D.   On: 08/30/2021 14:34    Positive ROS: All other systems have been reviewed and were otherwise negative with the exception of those mentioned in the HPI and as above.  Physical Exam: General: Alert, no acute distress Cardiovascular: No pedal edema Respiratory: No cyanosis, no use of accessory musculature GI: No organomegaly, abdomen is soft and non-tender Skin: No lesions in the area of chief complaint Neurologic: Sensation intact distally Psychiatric: Patient is competent for consent with normal mood and affect Lymphatic: No axillary or cervical lymphadenopathy  MUSCULOSKELETAL: Right lower extremity: Tenderness about the right hip, no tenderness about the knee or ankle, range of motion deferred.  Right lower extremity is grossly neurovascularly intact.   Assessment: 71 year old female admitted with a displaced right subtrochanteric femur fracture  Plan: I had a long discussion with the patient regarding her  fracture and operative and nonoperative treatment options.  We discussed both risks and benefits and details regarding postoperative recovery.  Recommendation was made for right femur intramedullary nail.  Informed consent was provided.    Renee Harder, MD    08/31/2021 3:12 PM

## 2021-08-31 NOTE — Progress Notes (Signed)
Nutrition Brief Note  RD consulted for assessment of nutritional requirements/ status.   Wt Readings from Last 15 Encounters:  08/30/21 70.2 kg  10/12/20 69.4 kg  11/06/19 68 kg  10/08/19 67.9 kg  10/07/18 70.3 kg  10/02/17 67.6 kg  09/03/17 67 kg   Pt with history of insomnia, presents for chief concerns of for fall.  Pt admitted with rt femoral fracture.   Reviewed I/O's: +10 ml x 24 hours  Pt awaiting orthopedics consult; pt NPO for potential surgery today.   Spoke with pt and husband at bedside. Pt reports that she and her husband are "normal, functioning people". Pt shares that she fells when she was working in her flower beds the other day. Per husband, she consumes 3 meals per day and they rarely eat fast food.   Pt denies any weight loss.   Nutrition-Focused physical exam completed. Findings are no fat depletion, no muscle depletion, and no edema.    Labs reviewed.   Current diet order is NPO, patient is consuming approximately n/a% of meals at this time. Labs and medications reviewed.   No nutrition interventions warranted at this time. If nutrition issues arise, please consult RD.   Loistine Chance, RD, LDN, [place] Registered Dietitian II Certified Diabetes Care and Education Specialist Please refer to [facility] for RD and/or RD on-call/weekend/after hours pager

## 2021-08-31 NOTE — Transfer of Care (Signed)
Immediate Anesthesia Transfer of Care Note  Patient: Brittany Weeks  Procedure(s) Performed: INTRAMEDULLARY (IM) NAIL INTERTROCHANTRIC (Right: Hip)  Patient Location: PACU  Anesthesia Type:General  Level of Consciousness: awake, drowsy and patient cooperative  Airway & Oxygen Therapy: Patient Spontanous Breathing and Patient connected to face mask oxygen  Post-op Assessment: Report given to RN and Post -op Vital signs reviewed and stable  Post vital signs: Reviewed and stable  Last Vitals:  Vitals Value Taken Time  BP 116/51 08/31/21 1820  Temp    Pulse    Resp 18 08/31/21 1820  SpO2      Last Pain:  Vitals:   08/31/21 1441  TempSrc: Oral  PainSc: 0-No pain         Complications: No notable events documented.

## 2021-09-01 ENCOUNTER — Encounter: Payer: Self-pay | Admitting: Orthopaedic Surgery

## 2021-09-01 DIAGNOSIS — F5104 Psychophysiologic insomnia: Secondary | ICD-10-CM | POA: Diagnosis not present

## 2021-09-01 DIAGNOSIS — S7221XA Displaced subtrochanteric fracture of right femur, initial encounter for closed fracture: Secondary | ICD-10-CM | POA: Diagnosis not present

## 2021-09-01 DIAGNOSIS — F3342 Major depressive disorder, recurrent, in full remission: Secondary | ICD-10-CM | POA: Diagnosis not present

## 2021-09-01 DIAGNOSIS — S7291XA Unspecified fracture of right femur, initial encounter for closed fracture: Secondary | ICD-10-CM | POA: Diagnosis not present

## 2021-09-01 DIAGNOSIS — D62 Acute posthemorrhagic anemia: Secondary | ICD-10-CM | POA: Diagnosis not present

## 2021-09-01 LAB — BASIC METABOLIC PANEL
Anion gap: 6 (ref 5–15)
BUN: 10 mg/dL (ref 8–23)
CO2: 25 mmol/L (ref 22–32)
Calcium: 8.3 mg/dL — ABNORMAL LOW (ref 8.9–10.3)
Chloride: 108 mmol/L (ref 98–111)
Creatinine, Ser: 0.51 mg/dL (ref 0.44–1.00)
GFR, Estimated: >60 mL/min (ref >60)
Glucose, Bld: 119 mg/dL — ABNORMAL HIGH (ref 70–99)
Potassium: 4.1 mmol/L (ref 3.5–5.1)
Sodium: 139 mmol/L (ref 135–145)

## 2021-09-01 LAB — CBC
HCT: 32.3 % — ABNORMAL LOW (ref 36.0–46.0)
Hemoglobin: 10.5 g/dL — ABNORMAL LOW (ref 12.0–15.0)
MCH: 31.6 pg (ref 26.0–34.0)
MCHC: 32.5 g/dL (ref 30.0–36.0)
MCV: 97.3 fL (ref 80.0–100.0)
Platelets: 231 10*3/uL (ref 150–400)
RBC: 3.32 MIL/uL — ABNORMAL LOW (ref 3.87–5.11)
RDW: 13.2 % (ref 11.5–15.5)
WBC: 12.7 10*3/uL — ABNORMAL HIGH (ref 4.0–10.5)
nRBC: 0 % (ref 0.0–0.2)

## 2021-09-01 MED ORDER — LACTATED RINGERS IV SOLN
INTRAVENOUS | Status: AC
Start: 2021-09-01 — End: 2021-09-02

## 2021-09-01 MED ORDER — CALCIUM CARBONATE ANTACID 500 MG PO CHEW
1.0000 | CHEWABLE_TABLET | Freq: Three times a day (TID) | ORAL | Status: DC | PRN
Start: 2021-09-01 — End: 2021-09-04
  Administered 2021-09-01 – 2021-09-02 (×3): 200 mg via ORAL
  Filled 2021-09-01 (×3): qty 1

## 2021-09-01 MED ORDER — FE FUMARATE-B12-VIT C-FA-IFC PO CAPS
1.0000 | ORAL_CAPSULE | Freq: Two times a day (BID) | ORAL | Status: DC
  Administered 2021-09-01 – 2021-09-02 (×3): 1 via ORAL
  Filled 2021-09-01 (×4): qty 1

## 2021-09-01 NOTE — NC FL2 (Signed)
[place] LEVEL OF CARE SCREENING TOOL     IDENTIFICATION  Patient Name: Brittany Weeks Birthdate: 08/17/1950 Sex: female Admission Date (Current Location): 08/30/2021  [facility] and [place] Number:  Engineering geologist and Address:  [facility], [address], [place], [place] [address]      Provider Number: [id number]  Attending Physician Name and Address:  Lorella Nimrod, MD  Relative Name and Phone Number:  Gwyndolyn Saxon husband [phone]    Current Level of Care: Hospital Recommended Level of Care: [place] Prior Approval Number:    Date Approved/Denied:   PASRR Number: [id number]  Discharge Plan: SNF    Current Diagnoses: Patient Active Problem List   Diagnosis Date Noted   Acute postoperative anemia due to expected blood loss 09/01/2021   Right femoral fracture ([place]) 08/30/2021   Insomnia 08/30/2021   Elevated MCV 10/09/2019   Tobacco abuse 10/03/2017   Major depression, recurrent, full remission ([place]) 09/03/2017    Orientation RESPIRATION BLADDER Height & Weight     Self, Time, Situation, Place  Normal Continent, External catheter Weight: 70.2 kg Height:     BEHAVIORAL SYMPTOMS/MOOD NEUROLOGICAL BOWEL NUTRITION STATUS      Continent Diet (see DC summary)  AMBULATORY STATUS COMMUNICATION OF NEEDS Skin   Extensive Assist Verbally Surgical wounds, Normal                       Personal Care Assistance Level of Assistance  Bathing, Feeding, Dressing Bathing Assistance: Limited assistance Feeding assistance: Independent Dressing Assistance: Limited assistance     Functional Limitations Info             SPECIAL CARE FACTORS FREQUENCY  PT (By licensed PT), OT (By licensed OT)     PT Frequency: 5 times per week OT Frequency: 5 times per week            Contractures Contractures Info: Not present    Additional Factors Info  Code Status, Allergies Code Status Info: Full  code Allergies Info: Epoxytropine  (Scopolamine), Other, Nickel           Current Medications (09/01/2021):  This is the current hospital active medication list Current Facility-Administered Medications  Medication Dose Route Frequency Provider Last Rate Last Admin   acetaminophen (TYLENOL) tablet 325-650 mg  325-650 mg Oral Q6H PRN Renee Harder, MD       acetaminophen (TYLENOL) tablet 500 mg  500 mg Oral Q6H Renee Harder, MD   500 mg at 09/01/21 1121   aspirin EC tablet 325 mg  325 mg Oral BID Renee Harder, MD   325 mg at 09/01/21 0847   docusate sodium (COLACE) capsule 100 mg  100 mg Oral BID Renee Harder, MD   100 mg at 09/01/21 0847   ferrous [id number] C-folic acid (TRINSICON / FOLTRIN) capsule 1 capsule  1 capsule Oral BID PC Lorella Nimrod, MD       HYDROcodone-acetaminophen (NORCO) 7.5-325 MG per tablet 1-2 tablet  1-2 tablet Oral Q4H PRN Renee Harder, MD       HYDROcodone-acetaminophen (NORCO/VICODIN) 5-325 MG per tablet 1-2 tablet  1-2 tablet Oral Q4H PRN Renee Harder, MD   2 tablet at 09/01/21 5974   lactated ringers infusion   Intravenous Continuous Lorella Nimrod, MD 100 mL/hr at 09/01/21 1121 New Bag at 09/01/21 1121   menthol-cetylpyridinium (CEPACOL) lozenge 3 mg  1 lozenge Oral PRN Renee Harder, MD       Or  phenol (CHLORASEPTIC) mouth spray 1 spray  1 spray Mouth/Throat PRN Renee Harder, MD       metoCLOPramide (REGLAN) tablet 5-10 mg  5-10 mg Oral Q8H PRN Renee Harder, MD       Or   metoCLOPramide (REGLAN) injection 5-10 mg  5-10 mg Intravenous Q8H PRN Renee Harder, MD       morphine (PF) 2 MG/ML injection 0.5-1 mg  0.5-1 mg Intravenous Q2H PRN Renee Harder, MD       nicotine (NICODERM CQ - dosed in mg/24 hours) patch 14 mg  14 mg Transdermal Daily PRN Renee Harder, MD       ondansetron [facility]) tablet 4 mg  4 mg Oral Q6H PRN Renee Harder, MD       Or   ondansetron [facility]) injection 4 mg  4 mg  Intravenous Q6H PRN Renee Harder, MD   4 mg at 09/01/21 1410   polyethylene glycol (MIRALAX / GLYCOLAX) packet 17 g  17 g Oral Daily PRN Renee Harder, MD       traZODone (DESYREL) tablet 100 mg  100 mg Oral Dewain Penning, MD   100 mg at 08/31/21 2125     Discharge Medications: Please see discharge summary for a list of discharge medications.  Relevant Imaging Results:  Relevant Lab Results:   Additional Information SS# [id number]  Conception Oms, RN

## 2021-09-01 NOTE — Progress Notes (Signed)
Subjective: Brittany Weeks is postop day 1 from right femur intramedullary nail placement with Dr. Sharlet Salina.  She reports she is doing well this morning.  She reports she is planning to discharged home.  We discussed she will work with physical therapy today.  Objective: Vital signs in last 24 hours: Temp:  [97.4 F (36.3 C)-98.8 F (37.1 C)] 98.1 F (36.7 C) (06/16 0750) Pulse Rate:  [72-93] 91 (06/16 0750) Resp:  [14-20] 16 (06/16 0750) BP: (92-129)/(47-69) 120/52 (06/16 0750) SpO2:  [93 %-100 %] 94 % (06/16 0750)  Intake/Output from previous day: 06/15 0701 - 06/16 0700 In: 1110 [P.O.:110; I.V.:700; IV Piggyback:100] Out: 1160 [Urine:860; Blood:300] Intake/Output this shift: No intake/output data recorded.  Right lower extremity exam Well-appearing bandages no bleedthrough or leakage.  She is tender in the proximal hip region.  No significant swelling.  Neurovascular intact distally with palpable distal pulses and moves all the digits appropriately  Lab Results Recent Labs    08/31/21 0337 09/01/21 0543  WBC 11.0* 12.7*  HGB 12.1 10.5*  HCT 37.7 32.3*  NA 140 139  K 4.0 4.1  CL 110 108  CO2 26 25  BUN 11 10  CREATININE 0.59 0.51   Liver Panel Recent Labs    08/30/21 1332  PROT 6.2*  ALBUMIN 3.4*  AST 15  ALT 14  ALKPHOS 55  BILITOT 0.5   Sedimentation Rate No results for input(s): "ESRSEDRATE" in the last 72 hours. C-Reactive Protein No results for input(s): "CRP" in the last 72 hours.  Microbiology: No results found for this or any previous visit (from the past 240 hour(s)).  Studies/Results: DG HIP UNILAT WITH PELVIS 2-3 VIEWS RIGHT  Result Date: 08/31/2021 CLINICAL DATA:  Right-sided intramedullary nail. EXAM: DG HIP (WITH OR WITHOUT PELVIS) 2-3V RIGHT COMPARISON:  Preoperative hip radiograph 08/30/2021 FINDINGS: Five fluoroscopic spot views of the right hip and femur obtained in the operating room. Long intramedullary nail with trans trochanteric and  distal locking screws traverse proximal femur fracture. Improved alignment from preoperative imaging. Fluoroscopy time 1 minutes 41 seconds. Dose 15.5 mGy. IMPRESSION: Intraoperative fluoroscopy during right femur fracture ORIF. Electronically Signed   By: Keith Rake M.D.   On: 08/31/2021 19:17   DG C-Arm 1-60 Min-No Report  Result Date: 08/31/2021 Fluoroscopy was utilized by the requesting physician.  No radiographic interpretation.   DG C-Arm 1-60 Min-No Report  Result Date: 08/31/2021 Fluoroscopy was utilized by the requesting physician.  No radiographic interpretation.   DG Chest Portable 1 View  Result Date: 08/30/2021 CLINICAL DATA:  Repeat chest radiograph to exclude pneumothorax. EXAM: PORTABLE CHEST 1 VIEW COMPARISON:  Same-day chest radiograph. FINDINGS: The cardiomediastinal silhouette is normal. There is no focal consolidation or pulmonary edema. There is no pneumothorax. The finding on the prior radiograph likely reflect a skin fold. There is no pleural effusion. The bones are stable.  No displaced rib fracture is seen. IMPRESSION: No pneumothorax. The finding on the prior radiograph likely reflected a skin fold. Electronically Signed   By: Valetta Mole M.D.   On: 08/30/2021 15:15   DG Chest 1 View  Addendum Date: 08/30/2021   ADDENDUM REPORT: 08/30/2021 14:48 ADDENDUM: These results were called by telephone at the time of interpretation on 08/30/2021 at 2:48 pm to provider Merlyn Lot , who verbally acknowledged these results. Electronically Signed   By: Zetta Bills M.D.   On: 08/30/2021 14:48   Result Date: 08/30/2021 CLINICAL DATA:  71 year old female presenting with fall landing on RIGHT  side, reported audible crack upon landing. EXAM: CHEST  1 VIEW COMPARISON:  None available. FINDINGS: EKG leads project over the chest. Cardiomediastinal contours and hilar structures are normal. Lucency over the RIGHT upper chest towards the RIGHT lung apex. Lung markings can be seen  passing peripheral to this area, this is favored to represent a skin fold. However, this cannot be followed beyond the pleural space on the frontal projection. No signs of lobar consolidation.  No evidence of pleural effusion. On limited assessment there is no acute skeletal finding. IMPRESSION: Lucency over the RIGHT upper chest towards the RIGHT lung apex. Lung markings can be seen peripherally though the area cannot be followed outside of the pleural space. While this is favored to represent a skin fold would suggest a repeat frontal view of the chest in expiration to exclude the less likely possibility of small pneumothorax on the RIGHT. Call is out to the referring provider to further discuss findings in the above case. Electronically Signed: By: Zetta Bills M.D. On: 08/30/2021 14:41   DG Hip Unilat  With Pelvis 2-3 Views Right  Result Date: 08/30/2021 CLINICAL DATA:  Fall, pain EXAM: DG HIP (WITH OR WITHOUT PELVIS) 2-3V RIGHT COMPARISON:  None Available. FINDINGS: Displaced, comminuted, impacted subtrochanteric fractures of the proximal right femur. The pelvis and proximal left femur appear intact in single frontal view. IMPRESSION: Displaced, comminuted, impacted subtrochanteric fractures of the proximal right femur. Electronically Signed   By: Delanna Ahmadi M.D.   On: 08/30/2021 14:36   DG Humerus Right  Result Date: 08/30/2021 CLINICAL DATA:  Fall, pain, initial encounter. EXAM: RIGHT HUMERUS - 2+ VIEW COMPARISON:  None. FINDINGS: No acute osseous abnormality. Degenerative changes in the right acromioclavicular joint. IMPRESSION: No acute findings. Electronically Signed   By: Lorin Picket M.D.   On: 08/30/2021 14:35   DG Forearm Right  Result Date: 08/30/2021 CLINICAL DATA:  Fall, pain, initial encounter. EXAM: RIGHT FOREARM - 2 VIEW COMPARISON:  None. FINDINGS: No acute osseous abnormality. IMPRESSION: No acute osseous abnormality. Electronically Signed   By: Lorin Picket M.D.   On:  08/30/2021 14:34    Medications: Reviewed  Assessment/Plan: Brittany Weeks is 1 day postop from right femur intramedullary nail placement with Dr. Sharlet Salina after a subtrochanteric femur fracture.  She is doing well today.  She will work with physical therapy.  Postoperative recommendations as below.  Follow-up in the office as scheduled. POSTOPERATIVE PLAN: She will be weightbearing as tolerated.   Ok to start DVT ppx POD#1 -aspirin 325 mg twice daily Dressing change by nursing staff as needed to keep dressing clean and dry Outpatient f/u in clinic in 2 Weeks for staple removal and xrays  LOS: 2 days    Roland Rack [facility] 09/01/2021, 7:56 AM

## 2021-09-01 NOTE — Care Management Important Message (Signed)
Important Message  Patient Details  Name: Brittany Weeks MRN: [id number] Date of Birth: 30-Apr-1950   Medicare Important Message Given:  N/A - LOS <3 / Initial given by admissions     [name] 09/01/2021, 2:24 PM

## 2021-09-01 NOTE — Progress Notes (Addendum)
Met with the patient and her husband in the room at the bedside She will need a rolling walker and 3 in 1 to be delivered to the bedside by [employer], husband provides transportation She will need HH PT, I reached out to [facility] And left message with Corene Cornea asking if they can accept the patient Awaiting a response  Update, [facility] has accepted her for [facility] and can See her Monday, that is agreeable

## 2021-09-01 NOTE — Progress Notes (Signed)
Progress Note   Patient: Brittany Weeks HCW:[id number] DOB: September 08, 1950 DOA: 08/30/2021     2 DOS: the patient was seen and examined on 09/01/2021   Brief hospital course: [name] is a 71 year old female with history of insomnia, presents emergency department for chief concerns of for fall.  Initial vitals in the emergency department showed temperature 98.3, respiration rate of 19, heart rate 105 and improved to 96, blood pressure 123/61, SPO2 95% on room air.  Serum sodium 140, potassium 2.8, chloride 112, bicarb 23, BUN of 16, serum creatinine of 2.66, nonfasting blood glucose 157, GFR greater than 60, WBC 10.5, hemoglobin 12.3, platelets of 248. Right hip imaging shows displaced, cumin noted, impacted subtrochanteric fracture of the proximal right femur. Right humerus and forearm x-rays were without any acute abnormality. Chest x-ray which was initially read as pneumothorax but found to be a skin tag and labeled as negative for any significant abnormality.  ED treatment: Ondansetron 4 mg IV one-time dose.  Morphine 4 mg IV.  Orthopedic was consulted and she will be going to the OR today.  6/16: S/p ORIF on 08/31/2021.  Tolerated the procedure well. Patient was becoming little dizzy with some drop in blood pressure during changing position while working with PT.  She was feeling weak.  Hemoglobin dropped to 10.5 from 12.1 preoperatively, most likely intraoperative blood loss.  Giving some IV fluid.  Also encouraging p.o. hydration Patient still wants to go home with home health services. Orthopedic is recommending full dose aspirin twice daily for postoperative DVT prophylaxis.   Assessment and Plan: * Right femoral fracture ([place])  Presumed secondary to mechanical fall in setting of pants being too long S/p ORIF.  Tolerated the procedure well. -PT is recommending SNF but patient wants to go home with home health services -Full dose twice daily aspirin for DVT prophylaxis  per orthopedic surgery -Continue with supportive care  Major depression, recurrent, full remission ([place]) - With insomnia - Trazodone 100 mg nightly  Acute postoperative anemia due to expected blood loss Hemoglobin dropped to 10.5 from 12.1 after the surgery. Also having some postural dizziness. -We have her some IV fluid -Encourage p.o. hydration -Start her on iron supplement -Continue to monitor  Hyperlipidemia-resolved as of 08/31/2021 Not on any meds at home  Tobacco abuse - As needed nicotine patch ordered  Insomnia - Resume trazodone 100 mg nightly  Subjective: Patient was feeling quite dizzy when she is started working with PT.  There was some decrease in blood pressure.  Feeling overall weak.  Physical Exam: Vitals:   08/31/21 2318 09/01/21 0335 09/01/21 0750 09/01/21 1423  BP: (!) 111/47 (!) 104/49 (!) 120/52 (!) 110/52  Pulse: 91 93 91 91  Resp: '15 15 16   '$ Temp: 97.7 F (36.5 C) 98.4 F (36.9 C) 98.1 F (36.7 C)   TempSrc: Oral     SpO2: 93% 93% 94%   Weight:       General.     In no acute distress. Pulmonary.  Lungs clear bilaterally, normal respiratory effort. CV.  Regular rate and rhythm, no JVD, rub or murmur. Abdomen.  Soft, nontender, nondistended, BS positive. CNS.  Alert and oriented .  No focal neurologic deficit. Extremities.  No edema, no cyanosis, pulses intact and symmetrical. Psychiatry.  Judgment and insight appears normal.  Data Reviewed: Prior notes and labs reviewed  Family Communication: Discussed with husband at bedside  Disposition: Status is: Inpatient Remains inpatient appropriate because: Severity of illness  Planned Discharge Destination: Home with Home Health  Time spent: 45 minutes  This record has been created using Systems analyst. Errors have been sought and corrected,but may not always be located. Such creation errors do not reflect on the standard of care.  Author: Lorella Nimrod, MD 09/01/2021  3:14 PM  For on call review [email or web address].

## 2021-09-01 NOTE — Progress Notes (Signed)
PT Cancellation Note  Patient Details Name: Brittany Weeks MRN: [id number] DOB: 1950-07-18   Cancelled Treatment:    Reason Eval/Treat Not Completed: Medical issues which prohibited therapy;Patient's level of consciousness (2 attempts to see patient again. Pt remains pretty nauseated and per self-report mentally "fuzzy." BP: 110s/50s, HR 90s. Will defer to next date.) RN made aware. Pt repositioned to allow HOB lower, bring feet up. Husband at bedside. All needs met.   2:36 PM, 09/01/21 Etta Grandchild, PT, DPT Physical Therapist - [facility]  [phone] ([place])    [place] C 09/01/2021, 2:35 PM

## 2021-09-01 NOTE — Assessment & Plan Note (Addendum)
Hemoglobin dropped to 10.5 from 12.1 after the surgery. Also having some postural dizziness. -We have her some IV fluid -Encourage p.o. hydration -Start her on iron supplement -Continue to monitor

## 2021-09-01 NOTE — Assessment & Plan Note (Addendum)
Presumed secondary to mechanical fall in setting of pants being too long S/p ORIF.  Tolerated the procedure well. -PT is recommending SNF but patient wants to go home with home health services -Full dose twice daily aspirin for DVT prophylaxis per orthopedic surgery -Continue with supportive care

## 2021-09-01 NOTE — Assessment & Plan Note (Signed)
-   With insomnia - Trazodone 100 mg nightly

## 2021-09-01 NOTE — Progress Notes (Cosign Needed)
Patient is not able to walk the distance required to go the bathroom, or he/she is unable to safely negotiate stairs required to access the bathroom.  A 3in1 BSC will alleviate this problem  

## 2021-09-01 NOTE — TOC Progression Note (Signed)
Transition of Care [facility]) - Progression Note    Patient Details  Name: KHRYSTINA BONNES MRN: [id number] Date of Birth: 07-25-1950  Transition of Care [facility]) CM/SW Sylvia, RN Phone Number: 09/01/2021, 4:31 PM  Clinical Narrative:    Met with the husband in the hall as the patient is working with PT, Agreeable to a bedsearch with the hope that she will improve to go home with HH, PASSR obtained, FL2 completed, Bedsearch sent   Expected Discharge Plan: [place] Barriers to Discharge: Continued Medical Work up  Expected Discharge Plan and Services Expected Discharge Plan: [place]   Discharge Planning Services: CM Consult   Living arrangements for the past 2 months: Single Family Home                 DME Arranged: 3-N-1, Walker rolling DME Agency: [employer] Date DME Agency Contacted: 09/01/21 Time DME Agency Contacted: [phone] Representative spoke with at DME Agency: Suanne Marker HH Arranged: PT [place]: [place] ([place]) Date [place]: 09/01/21 Time [place]: 1004 Representative spoke with at [place]: [place] ([place]) Interventions    Readmission Risk Interventions     No data to display

## 2021-09-01 NOTE — Assessment & Plan Note (Signed)
-   As needed nicotine patch ordered ?

## 2021-09-01 NOTE — Evaluation (Signed)
Occupational Therapy Evaluation Patient Details Name: Brittany Weeks MRN: [id number] DOB: 1950/11/07 Today's Date: 09/01/2021   History of Present Illness Brittany Weeks is a 45yoF who comes [facility] on 6/14 after a fall at home while gardening. PMH: insomnia, tobacco use, HLD. Workup revealing of Rt femur fracture. Pt underwent IM nail fixation on 6/15 c Dr. Renee Harder. Pt is subsequently WBAT.   Clinical Impression   Brittany Weeks was seen for OT evaluation this date. Prior to hospital admission, pt was Independent for mobility and I/ADLs. Pt lives with spouse. Pt presents to acute OT demonstrating impaired ADL performance and functional mobility 2/2 decreased activity tolerance and functional strength/ROM/balance deficits. Pt currently requires MIN A x2 + RW for BSC t/f, +2 for safety. Upon sitting on BSC pt reports "I'm going to pass out", BP checked 136/102, reports pain in bowels). MAX A pericare in standing. Pt would benefit from skilled OT to address noted impairments and functional limitations (see below for any additional details). Upon hospital discharge, recommend STR to maximize pt safety and return to PLOF.    Recommendations for follow up therapy are one component of a multi-disciplinary discharge planning process, led by the attending physician.  Recommendations may be updated based on patient status, additional functional criteria and insurance authorization.   Follow Up Recommendations  Skilled nursing-short term rehab (<3 hours/day)    Assistance Recommended at Discharge Frequent or constant Supervision/Assistance  Patient can return home with the following A lot of help with walking and/or transfers;A lot of help with bathing/dressing/bathroom    Functional Status Assessment  Patient has had a recent decline in their functional status and demonstrates the ability to make significant improvements in function in a reasonable and predictable amount of time.  Equipment  Recommendations  Other (comment) (defer)    Recommendations for Other Services       Precautions / Restrictions Precautions Precautions: Fall Restrictions Weight Bearing Restrictions: Yes RLE Weight Bearing: Weight bearing as tolerated      Mobility Bed Mobility Overal bed mobility: Needs Assistance Bed Mobility: Supine to Sit, Sit to Supine     Supine to sit: Mod assist, HOB elevated Sit to supine: Max assist        Transfers Overall transfer level: Needs assistance Equipment used: Rolling walker (2 wheels) Transfers: Sit to/from Stand, Bed to chair/wheelchair/BSC Sit to Stand: Min assist     Step pivot transfers: Min assist            Balance Overall balance assessment: Needs assistance Sitting-balance support: No upper extremity supported, Feet supported Sitting balance-Leahy Scale: Fair     Standing balance support: Bilateral upper extremity supported, During functional activity Standing balance-Leahy Scale: Fair                             ADL either performed or assessed with clinical judgement   ADL Overall ADL's : Needs assistance/impaired                                       General ADL Comments: MIN A x2 + RW for BSC t/f, +2 for safety. MAX A pericare in standing.      Pertinent Vitals/Pain Pain Assessment Pain Assessment: Faces Faces Pain Scale: Hurts even more Pain Location: bowels Pain Descriptors / Indicators: Aching, Operative site guarding Pain Intervention(s): Limited activity within patient's  tolerance     Hand Dominance     Extremity/Trunk Assessment Upper Extremity Assessment Upper Extremity Assessment: Overall WFL for tasks assessed   Lower Extremity Assessment Lower Extremity Assessment: Generalized weakness       Communication Communication Communication: No difficulties   Cognition Arousal/Alertness: Awake/alert Behavior During Therapy: WFL for tasks assessed/performed Overall  Cognitive Status: Within Functional Limits for tasks assessed                                                  Home Living Family/patient expects to be discharged to:: Private residence Living Arrangements: Spouse/significant other Available Help at Discharge: Family Type of Home: House Home Access: Stairs to enter Technical brewer of Steps: 3 Entrance Stairs-Rails: Left;Right Home Layout: One level               Home Equipment: None          Prior Functioning/Environment Prior Level of Function : Independent/Modified Independent                        OT Problem List: Decreased strength;Decreased range of motion;Decreased activity tolerance;Impaired balance (sitting and/or standing);Decreased safety awareness      OT Treatment/Interventions: Self-care/ADL training;Therapeutic exercise;Energy conservation;DME and/or AE instruction;Therapeutic activities;Patient/family education;Balance training    OT Goals(Current goals can be found in the care plan section) Acute Rehab OT Goals Patient Stated Goal: to go home OT Goal Formulation: With patient Time For Goal Achievement: 09/15/21 Potential to Achieve Goals: Good ADL Goals Pt Will Perform Grooming: standing;with supervision (will be able to tolerate >10 minutes) Pt Will Perform Lower Body Dressing: sit to/from stand;with supervision;with adaptive equipment Pt Will Transfer to Toilet: ambulating;regular height toilet;with supervision  OT Frequency: Min 2X/week    Co-evaluation              AM-PAC OT "6 Clicks" Daily Activity     Outcome Measure Help from another person eating meals?: None Help from another person taking care of personal grooming?: A Little Help from another person toileting, which includes using toliet, bedpan, or urinal?: A Lot Help from another person bathing (including washing, rinsing, drying)?: A Lot Help from another person to put on and taking off  regular upper body clothing?: A Little Help from another person to put on and taking off regular lower body clothing?: A Lot 6 Click Score: 16   End of Session Equipment Utilized During Treatment: Rolling walker (2 wheels) Nurse Communication: Mobility status  Activity Tolerance: Patient tolerated treatment well Patient left: in bed;with call bell/phone within reach;with bed alarm set;with family/visitor present  OT Visit Diagnosis: Unsteadiness on feet (R26.81);Muscle weakness (generalized) (M62.81)                Time: 0623-7628 OT Time Calculation (min): 32 min Charges:  OT General Charges $OT Visit: 1 Visit OT Evaluation $OT Eval Moderate Complexity: 1 Mod OT Treatments $Self Care/Home Management : 23-37 mins  Dessie Coma, M.S. OTR/L  09/01/21, 4:33 PM  ascom [phone]

## 2021-09-01 NOTE — Evaluation (Signed)
Physical Therapy Evaluation Patient Details Name: Brittany Weeks MRN: [id number] DOB: 06-04-1950 Today's Date: 09/01/2021  History of Present Illness  Brittany Weeks is a 51yoF who comes to [facility] on 6/14 after a fall at home while gardening. PMH: insomnia, tobacco use, HLD. Workup revealing of Rt femur fracture. Pt underwent IM nail fixation on 6/15 c Dr. Renee Harder. Pt is subsequently WBAT.  Clinical Impression  Pt admitted with above diagnosis. Pt currently with functional limitations due to the deficits listed below (see "PT Problem List"). Upon entry, pt in bed, awake and agreeable to participate. The pt is alert, pleasant, interactive, and able to provide info regarding prior level of function, both in tolerance and independence. Husband Bill in attendance throughout, also provides physical assist to EOB. Pain is well controlled at rest, but pt is unable to gfenerate much power in limb during ROM work. Pt requires modA to come to EOB with HOB elevated. Pt toelrates EOB x3 minutes with progressively worse presyncope, lightheadedness, eventually feels near faint. Pt returned to bed, elevated. MD/RN aware. Patient's performance this date reveals decreased ability, independence, and tolerance in performing all basic mobility required for performance of activities of daily living. Pt requires additional DME, close physical assistance, and cues for safe participate in mobility. Pt will benefit from skilled PT intervention to increase independence and safety with basic mobility in preparation for discharge to the venue listed below.         Recommendations for follow up therapy are one component of a multi-disciplinary discharge planning process, led by the attending physician.  Recommendations may be updated based on patient status, additional functional criteria and insurance authorization.  Follow Up Recommendations Skilled nursing-short term rehab (<3 hours/day)    Assistance Recommended  at Discharge Intermittent Supervision/Assistance  Patient can return home with the following  A little help with walking and/or transfers;A lot of help with walking and/or transfers;A little help with bathing/dressing/bathroom;Assistance with cooking/housework;Assist for transportation;Help with stairs or ramp for entrance    Equipment Recommendations Rolling walker (2 wheels);BSC/3in1  Recommendations for Other Services       Functional Status Assessment Patient has had a recent decline in their functional status and demonstrates the ability to make significant improvements in function in a reasonable and predictable amount of time.     Precautions / Restrictions Precautions Precautions: Fall Restrictions Weight Bearing Restrictions: Yes RLE Weight Bearing: Weight bearing as tolerated      Mobility  Bed Mobility Overal bed mobility: Needs Assistance Bed Mobility: Supine to Sit, Sit to Supine     Supine to sit: Mod assist, HOB elevated Sit to supine: Total assist (moved quickly due to presyncope report)        Transfers                        Ambulation/Gait                  Stairs            Wheelchair Mobility    Modified Rankin (Stroke Patients Only)       Balance Overall balance assessment: Modified Independent, History of Falls, Mild deficits observed, not formally tested                                           Pertinent Vitals/Pain Pain Assessment Pain Assessment: Faces  Faces Pain Scale: Hurts little more Pain Location: Rt groin quads Pain Descriptors / Indicators: Aching, Operative site guarding Pain Intervention(s): Limited activity within patient's tolerance, Monitored during session, Premedicated before session, Repositioned    Home Living Family/patient expects to be discharged to:: Private residence Living Arrangements: Spouse/significant other Available Help at Discharge: Family Type of Home:  House Home Access: Stairs to enter Entrance Stairs-Rails: Chemical engineer of Steps: 3   Home Layout: One level Home Equipment: None      Prior Function Prior Level of Function : Independent/Modified Independent                     Hand Dominance        Extremity/Trunk Assessment   Upper Extremity Assessment Upper Extremity Assessment: Overall WFL for tasks assessed    Lower Extremity Assessment Lower Extremity Assessment: Overall WFL for tasks assessed    Cervical / Trunk Assessment Cervical / Trunk Assessment: Normal  Communication      Cognition Arousal/Alertness: Awake/alert Behavior During Therapy: WFL for tasks assessed/performed Overall Cognitive Status: Within Functional Limits for tasks assessed                                          General Comments      Exercises Total Joint Exercises Heel Slides: AAROM, Limitations, Right, 5 reps Heel Slides Limitations: author provides 80-90% of movement Hip ABduction/ADduction: AAROM, Right, 5 reps, Limitations Hip Abduction/Adduction Limitations: author provides 80-90% of movement General Exercises - Lower Extremity Ankle Circles/Pumps: AROM, Both, 15 reps, Supine Heel Slides: AROM, Left, 10 reps, Supine Hip ABduction/ADduction: AROM, Left, 10 reps, Supine   Assessment/Plan    PT Assessment Patient needs continued PT services  PT Problem List Decreased strength;Decreased range of motion;Decreased activity tolerance;Decreased balance;Decreased mobility;Decreased coordination;Decreased knowledge of use of DME;Decreased safety awareness;Decreased knowledge of precautions       PT Treatment Interventions DME instruction;Balance training;Gait training;Stair training;Functional mobility training;Therapeutic activities;Therapeutic exercise;Patient/family education    PT Goals (Current goals can be found in the Care Plan section)  Acute Rehab PT Goals Patient Stated Goal:  be able to DC to home PT Goal Formulation: With patient Time For Goal Achievement: 09/15/21 Potential to Achieve Goals: Fair    Frequency BID     Co-evaluation               AM-PAC PT "6 Clicks" Mobility  Outcome Measure Help needed turning from your back to your side while in a flat bed without using bedrails?: A Lot Help needed moving from lying on your back to sitting on the side of a flat bed without using bedrails?: A Lot Help needed moving to and from a bed to a chair (including a wheelchair)?: A Lot Help needed standing up from a chair using your arms (e.g., wheelchair or bedside chair)?: A Lot Help needed to walk in hospital room?: Total Help needed climbing 3-5 steps with a railing? : Total 6 Click Score: 10    End of Session   Activity Tolerance: Treatment limited secondary to medical complications (Comment) (presyncope at EOB) Patient left: in bed;with family/visitor present;with call bell/phone within reach Nurse Communication: Mobility status PT Visit Diagnosis: Difficulty in walking, not elsewhere classified (R26.2);Other abnormalities of gait and mobility (R26.89);Muscle weakness (generalized) (M62.81);History of falling (Z91.81)    Time: 1003-1047 PT Time Calculation (min) (ACUTE ONLY): 44 min  Charges:   PT Evaluation $PT Eval Moderate Complexity: 1 Mod PT Treatments $Therapeutic Exercise: 8-22 mins      11:41 AM, 09/01/21 Etta Grandchild, PT, DPT Physical Therapist - [facility]  [phone] ([place])   Loyda Costin C 09/01/2021, 11:39 AM

## 2021-09-02 DIAGNOSIS — F5104 Psychophysiologic insomnia: Secondary | ICD-10-CM | POA: Diagnosis not present

## 2021-09-02 DIAGNOSIS — S7221XA Displaced subtrochanteric fracture of right femur, initial encounter for closed fracture: Secondary | ICD-10-CM | POA: Diagnosis not present

## 2021-09-02 DIAGNOSIS — S7291XA Unspecified fracture of right femur, initial encounter for closed fracture: Secondary | ICD-10-CM | POA: Diagnosis not present

## 2021-09-02 DIAGNOSIS — F3342 Major depressive disorder, recurrent, in full remission: Secondary | ICD-10-CM | POA: Diagnosis not present

## 2021-09-02 LAB — CBC
HCT: 31.1 % — ABNORMAL LOW (ref 36.0–46.0)
Hemoglobin: 10 g/dL — ABNORMAL LOW (ref 12.0–15.0)
MCH: 31.6 pg (ref 26.0–34.0)
MCHC: 32.2 g/dL (ref 30.0–36.0)
MCV: 98.4 fL (ref 80.0–100.0)
Platelets: 229 10*3/uL (ref 150–400)
RBC: 3.16 MIL/uL — ABNORMAL LOW (ref 3.87–5.11)
RDW: 13.1 % (ref 11.5–15.5)
WBC: 11.7 10*3/uL — ABNORMAL HIGH (ref 4.0–10.5)
nRBC: 0 % (ref 0.0–0.2)

## 2021-09-02 MED ORDER — SODIUM CHLORIDE 0.9 % IV BOLUS
1000.0000 mL | Freq: Once | INTRAVENOUS | Status: AC
  Administered 2021-09-02: 1000 mL via INTRAVENOUS

## 2021-09-02 NOTE — Assessment & Plan Note (Signed)
Hemoglobin dropped to 10.0 from 12.1 after the surgery. Also having some postural dizziness.  Positive orthostatic vitals.  Patient did receive 2 L of fluid yesterday -Giving 1 L of bolus -Encourage p.o. hydration -Continue iron supplement -Continue to monitor

## 2021-09-02 NOTE — Progress Notes (Signed)
Progress Note   Patient: Brittany Weeks WCH:[id number] DOB: [birth year] DOA: 08/30/2021     3 DOS: the patient was seen and examined on 09/02/2021   Brief hospital course: Brittany Weeks is a 71 year old female with history of insomnia, presents emergency department for chief concerns of for fall.  Initial vitals in the emergency department showed temperature 98.3, respiration rate of 19, heart rate 105 and improved to 96, blood pressure 123/61, SPO2 95% on room air.  Serum sodium 140, potassium 2.8, chloride 112, bicarb 23, BUN of 16, serum creatinine of 2.66, nonfasting blood glucose 157, GFR greater than 60, WBC 10.5, hemoglobin 12.3, platelets of 248. Right hip imaging shows displaced, cumin noted, impacted subtrochanteric fracture of the proximal right femur. Right humerus and forearm x-rays were without any acute abnormality. Chest x-ray which was initially read as pneumothorax but found to be a skin tag and labeled as negative for any significant abnormality.  ED treatment: Ondansetron 4 mg IV one-time dose.  Morphine 4 mg IV.  Orthopedic was consulted and she will be going to the OR today.  6/16: S/p ORIF on 08/31/2021.  Tolerated the procedure well. Patient was becoming little dizzy with some drop in blood pressure during changing position while working with PT.  She was feeling weak.  Hemoglobin dropped to 10.5 from 12.1 preoperatively, most likely intraoperative blood loss.  Giving some IV fluid.  Also encouraging p.o. hydration Patient still wants to go home with home health services. Orthopedic is recommending full dose aspirin twice daily for postoperative DVT prophylaxis.  6/17: Patient continued to experience dizziness with walking, orthostatic vitals were positive.  She still wants to go home instead of SNF.  Giving IV bolus PT is recommending SNF.   Assessment and Plan: * Right femoral fracture ([place])  Presumed secondary to mechanical fall in setting of pants being  too long S/p ORIF.  Tolerated the procedure well. -PT is recommending SNF but patient wants to go home with home health services -Full dose of aspirin twice daily aspirin for DVT prophylaxis per orthopedic surgery -Continue with supportive care  Major depression, recurrent, full remission ([place]) - With insomnia - Trazodone 100 mg nightly  Acute postoperative anemia due to expected blood loss Hemoglobin dropped to 10.0 from 12.1 after the surgery. Also having some postural dizziness.  Positive orthostatic vitals.  Patient did receive 2 L of fluid yesterday -Giving 1 L of bolus -Encourage p.o. hydration -Continue iron supplement -Continue to monitor  Hyperlipidemia-resolved as of 08/31/2021 Not on any meds at home  Tobacco abuse - As needed nicotine patch ordered  Insomnia - Resume trazodone 100 mg nightly   Subjective: Patient continued to feel weak and becoming dizzy with ambulation.  Husband at bedside.  Physical Exam: Vitals:   09/01/21 1655 09/01/21 2009 09/02/21 0536 09/02/21 0751  BP: (!) 108/59 122/62 (!) 111/55 (!) 122/51  Pulse: 89 88 84 89  Resp: '16 16 16 18  '$ Temp: 98 F (36.7 C) 98.2 F (36.8 C) 98.1 F (36.7 C) 98.2 F (36.8 C)  TempSrc:   Oral Oral  SpO2: 97% 93% 94% 93%  Weight:       General.  Well-developed elderly lady, in no acute distress. Pulmonary.  Lungs clear bilaterally, normal respiratory effort. CV.  Regular rate and rhythm, no JVD, rub or murmur. Abdomen.  Soft, nontender, nondistended, BS positive. CNS.  Alert and oriented .  No focal neurologic deficit. Extremities.  No edema, no cyanosis, pulses intact and symmetrical. Psychiatry.  Judgment and insight appears normal.  Data Reviewed: Prior notes and labs reviewed  Family Communication: Discussed with husband at bedside  Disposition: Status is: Inpatient Remains inpatient appropriate because: Severity of illness   Planned Discharge Destination: Home with Home Health  Time  spent: 45 minutes  This record has been created using Systems analyst. Errors have been sought and corrected,but may not always be located. Such creation errors do not reflect on the standard of care.  Author: Lorella Nimrod, MD 09/02/2021 1:55 PM  For on call review [email or web address].

## 2021-09-02 NOTE — Assessment & Plan Note (Signed)
Presumed secondary to mechanical fall in setting of pants being too long S/p ORIF.  Tolerated the procedure well. -PT is recommending SNF but patient wants to go home with home health services -Full dose of aspirin twice daily aspirin for DVT prophylaxis per orthopedic surgery -Continue with supportive care

## 2021-09-02 NOTE — Progress Notes (Signed)
Subjective: Brittany Weeks is postop day 2 from right femur intramedullary nail placement.  She is currently at the bedside chair transferring back to the bed, able to stand and ambulate.  Objective: Vital signs in last 24 hours: Temp:  [98 F (36.7 C)-98.2 F (36.8 C)] 98.2 F (36.8 C) (06/17 0751) Pulse Rate:  [84-89] 89 (06/17 0751) Resp:  [16-18] 18 (06/17 0751) BP: (108-122)/(51-62) 122/51 (06/17 0751) SpO2:  [93 %-97 %] 93 % (06/17 0751)  Intake/Output from previous day: 06/16 0701 - 06/17 0700 In: 1813.3 [I.V.:1813.3] Out: -  Intake/Output this shift: No intake/output data recorded.  General: Alert, comfortable Right lower extremity is grossly motor and sensory intact.  Dressings are clean, dry, intact.  Lab Results Recent Labs    08/31/21 0337 09/01/21 0543 09/02/21 0414  WBC 11.0* 12.7* 11.7*  HGB 12.1 10.5* 10.0*  HCT 37.7 32.3* 31.1*  NA 140 139  --   K 4.0 4.1  --   CL 110 108  --   CO2 26 25  --   BUN 11 10  --   CREATININE 0.59 0.51  --     Liver Panel No results for input(s): "PROT", "ALBUMIN", "AST", "ALT", "ALKPHOS", "BILITOT", "BILIDIR", "IBILI" in the last 72 hours.  Sedimentation Rate No results for input(s): "ESRSEDRATE" in the last 72 hours. C-Reactive Protein No results for input(s): "CRP" in the last 72 hours.  Microbiology: No results found for this or any previous visit (from the past 240 hour(s)).  Studies/Results: DG HIP UNILAT WITH PELVIS 2-3 VIEWS RIGHT  Result Date: 08/31/2021 CLINICAL DATA:  Right-sided intramedullary nail. EXAM: DG HIP (WITH OR WITHOUT PELVIS) 2-3V RIGHT COMPARISON:  Preoperative hip radiograph 08/30/2021 FINDINGS: Five fluoroscopic spot views of the right hip and femur obtained in the operating room. Long intramedullary nail with trans trochanteric and distal locking screws traverse proximal femur fracture. Improved alignment from preoperative imaging. Fluoroscopy time 1 minutes 41 seconds. Dose 15.5 mGy.  IMPRESSION: Intraoperative fluoroscopy during right femur fracture ORIF. Electronically Signed   By: Keith Rake M.D.   On: 08/31/2021 19:17   DG C-Arm 1-60 Min-No Report  Result Date: 08/31/2021 Fluoroscopy was utilized by the requesting physician.  No radiographic interpretation.   DG C-Arm 1-60 Min-No Report  Result Date: 08/31/2021 Fluoroscopy was utilized by the requesting physician.  No radiographic interpretation.    Medications: Reviewed  Assessment/Plan: Brittany Weeks is 2 day postop from right femur intramedullary nail placement  POSTOPERATIVE PLAN: She will be weightbearing as tolerated.  Current PT recs are for SNF Ok to start DVT ppx POD#1 -aspirin 325 mg twice daily Dressing change by nursing staff as needed to keep dressing clean and dry Outpatient f/u in clinic in 2 weeks for staple removal and xrays  LOS: 3 days    Renee Harder [facility] 09/02/2021, 3:38 PM

## 2021-09-02 NOTE — Progress Notes (Signed)
Physical Therapy Treatment Patient Details Name: Brittany Weeks MRN: [id number] DOB: Dec 16, 1950 Today's Date: 09/02/2021   History of Present Illness Lilie Vezina is a 62yoF who comes to [facility] on 6/14 after a fall at home while gardening. PMH: insomnia, tobacco use, HLD. Workup revealing of Rt femur fracture. Pt underwent IM nail fixation on 6/15 c Dr. Renee Harder. Pt is subsequently WBAT.    PT Comments    Pt was long sitting in bed upon arriving. She agrees to session and is cooperative and pleasant throughout. BP prior to OOB activity 117/58. Pt reports feeling bette than previous date and was willing to perform OOB activity. No dizziness reported upon sitting up EOB. She stood pivot to [facility]. Had successful BM prior to stand and ambulating to recliner. Still no c/o dizziness or severe pain. On 2nd trial of standing and ambulating ~ 5 ft, pt endorses severe dizziness and requested to sit down. BP 95/54. MD in room. BP medication will be held and Pryor Curia will return later this date to progress ambulation and attempt stairs. Pt currently refusing SNF at DC.Will update recs after PM PT session.   Recommendations for follow up therapy are one component of a multi-disciplinary discharge planning process, led by the attending physician.  Recommendations may be updated based on patient status, additional functional criteria and insurance authorization.  Follow Up Recommendations  Other (comment)     Assistance Recommended at Discharge  (pt currently unwilling to go to SNF." I'm going to go home with HHPT.")  Patient can return home with the following A little help with walking and/or transfers;A lot of help with walking and/or transfers;A little help with bathing/dressing/bathroom;Assistance with cooking/housework;Assist for transportation;Help with stairs or ramp for entrance   Equipment Recommendations  Other (comment) (pt's personal equipment in room already)       Precautions /  Restrictions Precautions Precautions: Fall Restrictions Weight Bearing Restrictions: No RLE Weight Bearing: Weight bearing as tolerated     Mobility  Bed Mobility Overal bed mobility: Needs Assistance Bed Mobility: Supine to Sit, Sit to Supine     Supine to sit: Min assist, HOB elevated     General bed mobility comments: Pt was able to exit L side of bed with increased time and min assist. Spouse present throughout session and very supportive.    Transfers Overall transfer level: Needs assistance Equipment used: Rolling walker (2 wheels) Transfers: Sit to/from Stand Sit to Stand: Min guard           General transfer comment: pt was able to stand from EOB, BSC and from recliner during session. vcs for technique only. CGA for safety however no physical lifting assistance required.    Ambulation/Gait Ambulation/Gait assistance: Min guard Gait Distance (Feet): 5 Feet Assistive device: Rolling walker (2 wheels) Gait Pattern/deviations: Step-to pattern, Antalgic, Decreased stride length, Decreased step length - left, Decreased stance time - right Gait velocity: decreased     General Gait Details: Pt was able to ambulate 2 x 5 ft. stood pivot to [facility] prior to ambulating 5 ft with RW to recliner. very slow antalgic step to pattern. Author planned to advance gait distances however upon 2nd trial of ambulation, pt c/o severe dizziness and needed to sit. BP 95/54. MD arrived during 2nd gait trial. Author will return this afternoon to advance gait and attempt stairs since pt is not agreeable to SNF     Balance Overall balance assessment: Needs assistance Sitting-balance support: No upper extremity supported, Feet supported Sitting  balance-Leahy Scale: Good Sitting balance - Comments: no balance deficits in sitting   Standing balance support: Bilateral upper extremity supported, During functional activity Standing balance-Leahy Scale: Fair Standing balance comment: no LOB with UE  support on RW however due to BP concerns is a high fall risk     Cognition Arousal/Alertness: Awake/alert Behavior During Therapy: WFL for tasks assessed/performed Overall Cognitive Status: Within Functional Limits for tasks assessed              Pertinent Vitals/Pain Pain Assessment Pain Assessment: 0-10 Pain Score: 3  Faces Pain Scale: Hurts a little bit Pain Location: R LE Pain Descriptors / Indicators: Aching, Operative site guarding Pain Intervention(s): Limited activity within patient's tolerance, Monitored during session, Premedicated before session, Repositioned, Ice applied     PT Goals (current goals can now be found in the care plan section) Acute Rehab PT Goals Patient Stated Goal: be able to DC to home Progress towards PT goals: Progressing toward goals    Frequency    BID      PT Plan Current plan remains appropriate       AM-PAC PT "6 Clicks" Mobility   Outcome Measure  Help needed turning from your back to your side while in a flat bed without using bedrails?: A Little Help needed moving from lying on your back to sitting on the side of a flat bed without using bedrails?: A Little Help needed moving to and from a bed to a chair (including a wheelchair)?: A Little Help needed standing up from a chair using your arms (e.g., wheelchair or bedside chair)?: A Little Help needed to walk in hospital room?: A Lot Help needed climbing 3-5 steps with a railing? : A Lot 6 Click Score: 16    End of Session   Activity Tolerance: Patient tolerated treatment well;Other (comment);Treatment limited secondary to medical complications (Comment) (limited by low BP) Patient left: in chair;with call bell/phone within reach;with chair alarm set;with family/visitor present;with SCD's reapplied Nurse Communication: Mobility status PT Visit Diagnosis: Difficulty in walking, not elsewhere classified (R26.2);Other abnormalities of gait and mobility (R26.89);Muscle weakness  (generalized) (M62.81);History of falling (Z91.81)     Time: 2035-5974 PT Time Calculation (min) (ACUTE ONLY): 35 min  Charges:  $Therapeutic Activity: 23-37 mins                     Julaine Fusi PTA 09/02/21, 11:01 AM

## 2021-09-02 NOTE — Progress Notes (Deleted)
Discharge Summary    After visit summary (AVS) reviewed with patient. AVS given to patient.  Discharge planning including follow up care, when to call the provider, activity restrictions, disease process, prescribed medications, what to do in an emergency situation, and how to sign up for MyChart was reviewed with the patient.  Counseling was provided with the patient regarding diagnosis and care team recommendations.  Valuables retrieved from locker and returned to patient.  Patient remained calm, cooperative, and controlled.  Patient denied suicidal ideations, homicidal ideations, auditory/visual hallucinations and contracts for safety after discharge.  Patient escorted by staff to main lobby where she was met by family who will drive them home safely.

## 2021-09-02 NOTE — Progress Notes (Signed)
Physical Therapy Treatment Patient Details Name: Brittany Weeks MRN: [id number] DOB: [birth year] Today's Date: 09/02/2021   History of Present Illness Brittany Weeks is a 62yoF who comes to [facility] on 6/14 after a fall at home while gardening. PMH: insomnia, tobacco use, HLD. Workup revealing of Rt femur fracture. Pt underwent IM nail fixation on 6/15 c Dr. Renee Harder. Pt is subsequently WBAT.    PT Comments    Pt was still sitting in recliner from AM session upon arriving. Supportive spouse still present. She agrees to session. BP while sitting in recliner 104/49. Upon standing up,  CGA for safety + vcs for hand placement and technique improvements, BP dropped to 80/41. Pt is symptomatic and required to return to sitting with BLEs elevated. MD and RN made aware. MD ordered IV bolus. PT will return next date and continue to progress pt per POC. Current recommendation is for DC to SNF however pt currently unwilling. Will continue to update recs as progress is made.    Recommendations for follow up therapy are one component of a multi-disciplinary discharge planning process, led by the attending physician.  Recommendations may be updated based on patient status, additional functional criteria and insurance authorization.  Follow Up Recommendations  Skilled nursing-short term rehab (<3 hours/day) (pt unwilling to DC to rehab)     Assistance Recommended at Discharge Frequent or constant Supervision/Assistance  Patient can return home with the following A little help with walking and/or transfers;A lot of help with walking and/or transfers;A little help with bathing/dressing/bathroom;Assistance with cooking/housework;Assist for transportation;Help with stairs or ramp for entrance   Equipment Recommendations  Other (comment) (pt has recieved all equipment needs to her room already)       Precautions / Restrictions Precautions Precautions: Fall Restrictions Weight Bearing Restrictions:  Yes RLE Weight Bearing: Weight bearing as tolerated     Mobility  Bed Mobility Overal bed mobility: Needs Assistance Bed Mobility: Supine to Sit, Sit to Supine     Supine to sit: Min assist, HOB elevated     General bed mobility comments: pt was sitting in recliner pre/post session.    Transfers Overall transfer level: Needs assistance Equipment used: Rolling walker (2 wheels) Transfers: Sit to/from Stand Sit to Stand: Min guard    General transfer comment: pt BP in sitting 104/49       standing 80/41. RN and MD messaged. IV bolus ordered. PT will return at later date and conitnue to follow and progress as able per current POC.    Ambulation/Gait Ambulation/Gait assistance: Min guard Gait Distance (Feet): 5 Feet Assistive device: Rolling walker (2 wheels) Gait Pattern/deviations: Step-to pattern, Antalgic, Decreased stride length, Decreased step length - left, Decreased stance time - right Gait velocity: decreased     General Gait Details: unsafe due to hypotension concerns    Balance Overall balance assessment: Needs assistance Sitting-balance support: No upper extremity supported, Feet supported Sitting balance-Leahy Scale: Good Sitting balance - Comments: no balance deficits in sitting   Standing balance support: Bilateral upper extremity supported, During functional activity Standing balance-Leahy Scale: Fair Standing balance comment: no LOB with UE support on RW however due to BP concerns is a high fall risk      Cognition Arousal/Alertness: Awake/alert Behavior During Therapy: WFL for tasks assessed/performed Overall Cognitive Status: Within Functional Limits for tasks assessed      General Comments: Pt is A and O x 4           General Comments General comments (skin  integrity, edema, etc.): did issue HEP handout however unable to go over. Need to review in future sessions      Pertinent Vitals/Pain Pain Assessment Pain Assessment: 0-10 Pain Score:  3  Faces Pain Scale: Hurts a little bit Pain Location: R LE Pain Descriptors / Indicators: Aching, Operative site guarding Pain Intervention(s): Monitored during session, Limited activity within patient's tolerance, Premedicated before session, Repositioned     PT Goals (current goals can now be found in the care plan section) Acute Rehab PT Goals Patient Stated Goal: DC home monday Progress towards PT goals: Not progressing toward goals - comment (hypotension has limited pt progress towards PT goals)    Frequency    BID      PT Plan Current plan remains appropriate       AM-PAC PT "6 Clicks" Mobility   Outcome Measure  Help needed turning from your back to your side while in a flat bed without using bedrails?: A Little Help needed moving from lying on your back to sitting on the side of a flat bed without using bedrails?: A Little Help needed moving to and from a bed to a chair (including a wheelchair)?: A Little Help needed standing up from a chair using your arms (e.g., wheelchair or bedside chair)?: A Little Help needed to walk in hospital room?: A Little Help needed climbing 3-5 steps with a railing? : A Lot 6 Click Score: 17    End of Session   Activity Tolerance: Treatment limited secondary to medical complications (Comment) (limited session due to hypotension concerns) Patient left: in chair;with call bell/phone within reach;with chair alarm set;with family/visitor present;with SCD's reapplied Nurse Communication: Mobility status PT Visit Diagnosis: Difficulty in walking, not elsewhere classified (R26.2);Other abnormalities of gait and mobility (R26.89);Muscle weakness (generalized) (M62.81);History of falling (Z91.81)     Time: 9371-6967 PT Time Calculation (min) (ACUTE ONLY): 9 min  Charges:  $Therapeutic Activity: 8-22 mins                    Julaine Fusi PTA 09/02/21, 2:27 PM

## 2021-09-03 DIAGNOSIS — R42 Dizziness and giddiness: Secondary | ICD-10-CM

## 2021-09-03 DIAGNOSIS — F5104 Psychophysiologic insomnia: Secondary | ICD-10-CM | POA: Diagnosis not present

## 2021-09-03 DIAGNOSIS — F3342 Major depressive disorder, recurrent, in full remission: Secondary | ICD-10-CM | POA: Diagnosis not present

## 2021-09-03 DIAGNOSIS — S7291XA Unspecified fracture of right femur, initial encounter for closed fracture: Secondary | ICD-10-CM | POA: Diagnosis not present

## 2021-09-03 DIAGNOSIS — S7221XA Displaced subtrochanteric fracture of right femur, initial encounter for closed fracture: Secondary | ICD-10-CM | POA: Diagnosis not present

## 2021-09-03 HISTORY — DX: Dizziness and giddiness: R42

## 2021-09-03 LAB — CBC
HCT: 27.5 % — ABNORMAL LOW (ref 36.0–46.0)
Hemoglobin: 8.9 g/dL — ABNORMAL LOW (ref 12.0–15.0)
MCH: 31.6 pg (ref 26.0–34.0)
MCHC: 32.4 g/dL (ref 30.0–36.0)
MCV: 97.5 fL (ref 80.0–100.0)
Platelets: 231 10*3/uL (ref 150–400)
RBC: 2.82 MIL/uL — ABNORMAL LOW (ref 3.87–5.11)
RDW: 13.2 % (ref 11.5–15.5)
WBC: 9.8 10*3/uL (ref 4.0–10.5)
nRBC: 0 % (ref 0.0–0.2)

## 2021-09-03 MED ORDER — FE FUMARATE-B12-VIT C-FA-IFC PO CAPS
1.0000 | ORAL_CAPSULE | Freq: Three times a day (TID) | ORAL | Status: DC
  Administered 2021-09-03 – 2021-09-04 (×4): 1 via ORAL
  Filled 2021-09-03 (×5): qty 1

## 2021-09-03 MED ORDER — LACTATED RINGERS IV SOLN: INTRAVENOUS | Status: AC

## 2021-09-03 NOTE — Assessment & Plan Note (Signed)
Hemoglobin dropped to 10.0>>8.9 from 12.1 after the surgery.  No obvious bleeding except during surgery. -Continue iron supplement -Continue to monitor

## 2021-09-03 NOTE — Progress Notes (Signed)
Progress Note   Patient: Brittany Weeks:[id number] DOB: Aug 05, 1950 DOA: 08/30/2021     4 DOS: the patient was seen and examined on 09/03/2021   Brief hospital course: [name] is a 71 year old female with history of insomnia, presents emergency department for chief concerns of for fall.  Initial vitals in the emergency department showed temperature 98.3, respiration rate of 19, heart rate 105 and improved to 96, blood pressure 123/61, SPO2 95% on room air.  Serum sodium 140, potassium 2.8, chloride 112, bicarb 23, BUN of 16, serum creatinine of 2.66, nonfasting blood glucose 157, GFR greater than 60, WBC 10.5, hemoglobin 12.3, platelets of 248. Right hip imaging shows displaced, cumin noted, impacted subtrochanteric fracture of the proximal right femur. Right humerus and forearm x-rays were without any acute abnormality. Chest x-ray which was initially read as pneumothorax but found to be a skin tag and labeled as negative for any significant abnormality.  ED treatment: Ondansetron 4 mg IV one-time dose.  Morphine 4 mg IV.  Orthopedic was consulted and she will be going to the OR today.  6/16: S/p ORIF on 08/31/2021.  Tolerated the procedure well. Patient was becoming little dizzy with some drop in blood pressure during changing position while working with PT.  She was feeling weak.  Hemoglobin dropped to 10.5 from 12.1 preoperatively, most likely intraoperative blood loss.  Giving some IV fluid.  Also encouraging p.o. hydration Patient still wants to go home with home health services. Orthopedic is recommending full dose aspirin twice daily for postoperative DVT prophylaxis.  6/17: Patient continued to experience dizziness with walking, orthostatic vitals were positive.  She still wants to go home instead of SNF.  Giving IV bolus PT is recommending SNF.  6/18: Hemoglobin dropped to 8.9 this morning, blood pressure remained on softer side.  Orthostatic vitals with some drop  after standing, otherwise much improved.  Continued to have dizziness with ambulation, giving some more IV fluid.  Husband is requesting wheelchair and he is trying to get the ramp to get in the house.   Assessment and Plan: * Right femoral fracture ([place])  Presumed secondary to mechanical fall in setting of pants being too long S/p ORIF.  Tolerated the procedure well. -PT is recommending SNF but patient wants to go home with home health services -Full dose of aspirin twice daily aspirin for DVT prophylaxis per orthopedic surgery -Continue with supportive care  Major depression, recurrent, full remission ([place]) - With insomnia - Trazodone 100 mg nightly  Acute postoperative anemia due to expected blood loss Hemoglobin dropped to 10.0>>8.9 from 12.1 after the surgery.  No obvious bleeding except during surgery. -Continue iron supplement -Continue to monitor  Hyperlipidemia-resolved as of 08/31/2021 Not on any meds at home  Tobacco abuse - As needed nicotine patch ordered  Insomnia - Resume trazodone 100 mg nightly  Postural dizziness Most likely secondary to decreased intravascular volume as she was having positive orthostatic vitals yesterday, some improvement in orthostasis today but she remained pretty dizzy with ambulation. -Give her 1 more liter of IV fluid. -Continue to monitor   Subjective: Patient continued to feel dizzy with ambulation.  Still does not think that she can safely go home.  She still wants to go home instead of SNF  Physical Exam: Vitals:   09/02/21 1555 09/02/21 2019 09/03/21 0336 09/03/21 1139  BP: (!) 115/55 (!) 121/59 (!) 92/45 (!) 117/49  Pulse: 81 86 60 83  Resp: '17 16 17 14  '$ Temp: 98.1  F (36.7 C) 98.3 F (36.8 C) 98.4 F (36.9 C) 97.7 F (36.5 C)  TempSrc:  Oral  Oral  SpO2: 98% 100%  98%  Weight:       General.     In no acute distress. Pulmonary.  Lungs clear bilaterally, normal respiratory effort. CV.  Regular rate and rhythm, no  JVD, rub or murmur. Abdomen.  Soft, nontender, nondistended, BS positive. CNS.  Alert and oriented .  No focal neurologic deficit. Extremities.  No edema, no cyanosis, pulses intact and symmetrical. Psychiatry.  Judgment and insight appears normal.  Data Reviewed: Prior notes and labs reviewed  Family Communication: Discussed with husband at bedside  Disposition: Status is: Inpatient Remains inpatient appropriate because: Severity of illness   Planned Discharge Destination: Home with Home Health  Time spent: 45 minutes  This record has been created using Systems analyst. Errors have been sought and corrected,but may not always be located. Such creation errors do not reflect on the standard of care.  Author: Lorella Nimrod, MD 09/03/2021 12:57 PM  For on call review [email or web address].

## 2021-09-03 NOTE — Progress Notes (Signed)
Physical Therapy Treatment Patient Details Name: Brittany Weeks MRN: [id number] DOB: Jul 04, 1950 Today's Date: 09/03/2021   History of Present Illness Brittany Weeks is a 32yoF who comes [facility] on 6/14 after a fall at home while gardening. PMH: insomnia, tobacco use, HLD. Workup revealing of Rt femur fracture. Pt underwent IM nail fixation on 6/15 c Dr. Renee Harder. Pt is subsequently WBAT.    PT Comments    Orthostatic BP's taken 118/62 P 96 111/48 P 94 113/68 P 100 95/68 P 100  After sitting on commode 95/47 P 98 After 5 min on commode 11091 P 95  Pt with min a and increased time to get to sitting and then to commode.  After extended time on commode she is able to transfer to recliner at bedside but gait remains limited due to BP's and "fuzziness"  Long discussion with pt and family about discharge plan.  They do prefer home over SNF and at this point they wish to transition home vs SNF.  Walker and commode have been delivered to room but pt is yet to walk household distances and would need a wheelchair for in home mobility and community.  They have 3 steps to get into home and portable ramp rental was discussed with them and they are open to the idea.  Ideally pt will progress with mobility and not need additional equipment but if it does not she will need wheelchair and ramp.  Overall pt is good with transfers and limited gait and it is reasonable to expect improved mobility once orthostatics improve.  Will leave recommendations as SNF for now but with supports in place HHPT is reasonable when medically improved.  Secure chat to MD RN regarding continued orthostatics.   Recommendations for follow up therapy are one component of a multi-disciplinary discharge planning process, led by the attending physician.  Recommendations may be updated based on patient status, additional functional criteria and insurance authorization.  Follow Up Recommendations  Skilled nursing-short term  rehab (<3 hours/day)     Assistance Recommended at Discharge Frequent or constant Supervision/Assistance  Patient can return home with the following A little help with walking and/or transfers;A lot of help with walking and/or transfers;A little help with bathing/dressing/bathroom;Assistance with cooking/housework;Assist for transportation;Help with stairs or ramp for entrance   Equipment Recommendations  Rolling walker (2 wheels);BSC/3in1;Wheelchair (measurements PT);Wheelchair cushion (measurements PT)    Recommendations for Other Services       Precautions / Restrictions Precautions Precautions: Fall;Posterior Hip Restrictions Weight Bearing Restrictions: Yes RLE Weight Bearing: Weight bearing as tolerated     Mobility  Bed Mobility Overal bed mobility: Needs Assistance Bed Mobility: Supine to Sit     Supine to sit: Min assist, HOB elevated          Transfers Overall transfer level: Needs assistance Equipment used: Rolling walker (2 wheels) Transfers: Sit to/from Stand Sit to Stand: Min guard, Min assist   Step pivot transfers: Min assist            Ambulation/Gait Ambulation/Gait assistance: Min guard Gait Distance (Feet): 5 Feet Assistive device: Rolling walker (2 wheels) Gait Pattern/deviations: Step-to pattern, Antalgic, Decreased stride length, Decreased step length - left, Decreased stance time - right Gait velocity: decreased     General Gait Details: unsafe due to hypotension concerns   Stairs             Wheelchair Mobility    Modified Rankin (Stroke Patients Only)       Balance Overall  balance assessment: Needs assistance Sitting-balance support: No upper extremity supported, Feet supported Sitting balance-Leahy Scale: Good     Standing balance support: Bilateral upper extremity supported, During functional activity Standing balance-Leahy Scale: Fair Standing balance comment: no LOB with UE support on RW                             Cognition Arousal/Alertness: Awake/alert Behavior During Therapy: WFL for tasks assessed/performed Overall Cognitive Status: Within Functional Limits for tasks assessed                                          Exercises Other Exercises Other Exercises: long discussion with husband and pt regarding discharge plan    General Comments        Pertinent Vitals/Pain Pain Assessment Pain Assessment: 0-10 Pain Score: 3  Pain Location: R LE Pain Descriptors / Indicators: Aching, Operative site guarding Pain Intervention(s): Monitored during session, Repositioned    Home Living                          Prior Function            PT Goals (current goals can now be found in the care plan section) Progress towards PT goals: Progressing toward goals    Frequency    BID      PT Plan Current plan remains appropriate    Co-evaluation              AM-PAC PT "6 Clicks" Mobility   Outcome Measure  Help needed turning from your back to your side while in a flat bed without using bedrails?: A Little Help needed moving from lying on your back to sitting on the side of a flat bed without using bedrails?: A Little Help needed moving to and from a bed to a chair (including a wheelchair)?: A Little Help needed standing up from a chair using your arms (e.g., wheelchair or bedside chair)?: A Little Help needed to walk in hospital room?: A Little Help needed climbing 3-5 steps with a railing? : A Lot 6 Click Score: 17    End of Session Equipment Utilized During Treatment: Gait belt Activity Tolerance: Treatment limited secondary to medical complications (Comment) Patient left: in chair;with call bell/phone within reach;with chair alarm set;with family/visitor present Nurse Communication: Mobility status;Other (comment) PT Visit Diagnosis: Difficulty in walking, not elsewhere classified (R26.2);Other abnormalities of gait and mobility  (R26.89);Muscle weakness (generalized) (M62.81);History of falling (Z91.81)     Time: 0921-1000 PT Time Calculation (min) (ACUTE ONLY): 39 min  Charges:  $Gait Training: 8-22 mins $Therapeutic Activity: 23-37 mins                   Chesley Noon, PTA 09/03/21, 10:11 AM

## 2021-09-03 NOTE — TOC Progression Note (Signed)
Transition of Care [facility]) - Progression Note    Patient Details  Name: Brittany Weeks MRN: [id number] Date of Birth: 1950/10/18  Transition of Care [facility]) CM/SW [place], LCSW Phone Number: 09/03/2021, 2:24 PM  Clinical Narrative:   CSW spoke with patient.  Patient states she is refusing SNF, her plan is to go home with home health when medically ready.  Patient states her husband will provide 24 hour care at home. Patient is active with [facility]. TOC to monitor for additional DME needs, possibly wheelchair depending on how she is doing at time of discharge.    Expected Discharge Plan: [place] Barriers to Discharge: Continued Medical Work up  Expected Discharge Plan and Services Expected Discharge Plan: [place]   Discharge Planning Services: CM Consult   Living arrangements for the past 2 months: Single Family Home                 DME Arranged: 3-N-1, Walker rolling DME Agency: [employer] Date DME Agency Contacted: 09/01/21 Time DME Agency Contacted: [phone] Representative spoke with at DME Agency: Suanne Marker HH Arranged: PT [place]: [place] ([place]) Date [place]: 09/01/21 Time [place]: 1004 Representative spoke with at [place]: Corene Cornea   Social Determinants of Health ([place]) Interventions    Readmission Risk Interventions     No data to display

## 2021-09-03 NOTE — Assessment & Plan Note (Signed)
Most likely secondary to decreased intravascular volume as she was having positive orthostatic vitals yesterday, some improvement in orthostasis today but she remained pretty dizzy with ambulation. -Give her 1 more liter of IV fluid. -Continue to monitor

## 2021-09-03 NOTE — Plan of Care (Signed)
  Problem: Education: Goal: Knowledge of General Education information will improve Description Including pain rating scale, medication(s)/side effects and non-pharmacologic comfort measures Outcome: Progressing   Problem: Health Behavior/Discharge Planning: Goal: Ability to manage health-related needs will improve Outcome: Progressing   

## 2021-09-03 NOTE — Progress Notes (Signed)
Subjective: Brittany Weeks is postop day 3 from right femur intramedullary nail placement.  She is currently at the bedside chair eating lunch.  Objective: Vital signs in last 24 hours: Temp:  [97.7 F (36.5 C)-98.4 F (36.9 C)] 97.7 F (36.5 C) (06/18 1139) Pulse Rate:  [60-86] 83 (06/18 1139) Resp:  [14-17] 14 (06/18 1139) BP: (92-121)/(45-59) 117/49 (06/18 1139) SpO2:  [98 %-100 %] 98 % (06/18 1139)  Intake/Output from previous day: 06/17 0701 - 06/18 0700 In: 1220 [P.O.:720; IV Piggyback:500] Out: 550 [Urine:550] Intake/Output this shift: No intake/output data recorded.  General: Alert, comfortable Right lower extremity is grossly motor and sensory intact.  Dressings are clean, dry, intact.  Lab Results Recent Labs    09/01/21 0543 09/02/21 0414 09/03/21 0349  WBC 12.7* 11.7* 9.8  HGB 10.5* 10.0* 8.9*  HCT 32.3* 31.1* 27.5*  NA 139  --   --   K 4.1  --   --   CL 108  --   --   CO2 25  --   --   BUN 10  --   --   CREATININE 0.51  --   --     Liver Panel No results for input(s): "PROT", "ALBUMIN", "AST", "ALT", "ALKPHOS", "BILITOT", "BILIDIR", "IBILI" in the last 72 hours.  Sedimentation Rate No results for input(s): "ESRSEDRATE" in the last 72 hours. C-Reactive Protein No results for input(s): "CRP" in the last 72 hours.  Microbiology: No results found for this or any previous visit (from the past 240 hour(s)).  Studies/Results: No results found.  Medications: Reviewed  Assessment/Plan: Brittany Weeks is 3 day postop from right femur intramedullary nail placement  POSTOPERATIVE PLAN: She will be weightbearing as tolerated.  Current PT recs are for SNF although patient would like to go home. DVT ppx: aspirin 325 mg twice daily Dressing change by nursing staff as needed to keep dressing clean and dry Outpatient f/u in clinic in 2 weeks for staple removal and xrays  LOS: 4 days    Renee Harder  09/03/2021, 2:20 PM

## 2021-09-04 DIAGNOSIS — D62 Acute posthemorrhagic anemia: Secondary | ICD-10-CM

## 2021-09-04 LAB — CBC
HCT: 27.3 % — ABNORMAL LOW (ref 36.0–46.0)
Hemoglobin: 8.8 g/dL — ABNORMAL LOW (ref 12.0–15.0)
MCH: 31.3 pg (ref 26.0–34.0)
MCHC: 32.2 g/dL (ref 30.0–36.0)
MCV: 97.2 fL (ref 80.0–100.0)
Platelets: 259 10*3/uL (ref 150–400)
RBC: 2.81 MIL/uL — ABNORMAL LOW (ref 3.87–5.11)
RDW: 13.3 % (ref 11.5–15.5)
WBC: 8.6 10*3/uL (ref 4.0–10.5)
nRBC: 0 % (ref 0.0–0.2)

## 2021-09-04 MED ORDER — ASPIRIN 325 MG PO TBEC: 325.0000 mg | DELAYED_RELEASE_TABLET | Freq: Two times a day (BID) | ORAL | 1 refills | Status: DC

## 2021-09-04 MED ORDER — FE FUMARATE-B12-VIT C-FA-IFC PO CAPS: 1.0000 | ORAL_CAPSULE | Freq: Three times a day (TID) | ORAL | 1 refills | Status: DC

## 2021-09-04 MED ORDER — HYDROCODONE-ACETAMINOPHEN 5-325 MG PO TABS: 1.0000 | ORAL_TABLET | ORAL | 0 refills | Status: DC | PRN

## 2021-09-04 MED ORDER — DOCUSATE SODIUM 100 MG PO CAPS: 100.0000 mg | ORAL_CAPSULE | Freq: Two times a day (BID) | ORAL | 0 refills | Status: DC

## 2021-09-04 MED ORDER — POLYETHYLENE GLYCOL 3350 17 G PO PACK: 17.0000 g | PACK | Freq: Every day | ORAL | 0 refills | Status: DC | PRN

## 2021-09-04 NOTE — Progress Notes (Signed)
Patient suffers from femur fracture which impairs their ability to perform daily activities like ADLS in the home.  A walking aide will not resolve  issue with performing activities of daily living. A wheelchair will allow patient to safely perform daily activities. Patient is not able to propel themselves in the home using a standard weight wheelchair due to weakness. Patient can self propel in the lightweight wheelchair. Length of need 6 months Accessories: elevating leg rests (ELRs), wheel locks, extensions and anti-tippers. Back cushion

## 2021-09-04 NOTE — Care Management Important Message (Signed)
Important Message  Patient Details  Name: Brittany Weeks MRN: [id number] Date of Birth: Apr 03, 1950   Medicare Important Message Given:  Yes     Juliann Pulse A Jalyne Brodzinski 09/04/2021, 2:06 PM

## 2021-09-04 NOTE — Plan of Care (Signed)

## 2021-09-04 NOTE — TOC Progression Note (Signed)
Transition of Care [facility]) - Progression Note    Patient Details  Name: Brittany Weeks MRN: [id number] Date of Birth: Jun 05, 1950  Transition [facility]) CM/SW Contact  Conception Oms, RN Phone Number: 09/04/2021, 1:02 PM  Clinical Narrative:    Patient to DC today with [facility], she will need a wheelchair delivered to the room by [employer] Her husband will provide transportation   Expected Discharge Plan: [place] Barriers to Discharge: Continued Medical Work up  Expected Discharge Plan and Services Expected Discharge Plan: [place]   Discharge Planning Services: CM Consult   Living arrangements for the past 2 months: Single Family Home                 DME Arranged: 3-N-1, Walker rolling DME Agency: [employer] Date DME Agency Contacted: 09/01/21 Time DME Agency Contacted: [phone] Representative spoke with at DME Agency: Suanne Marker HH Arranged: PT [place]: [place] ([place]) Date HH Agency Contacted: 09/01/21 Time [place]: 1004 Representative spoke with at [place]: [place] ([place]) Interventions    Readmission Risk Interventions     No data to display

## 2021-09-04 NOTE — Progress Notes (Addendum)
Physical Therapy Treatment Patient Details Name: Brittany Weeks MRN: [id number] DOB: 10-Dec-1950 Today's Date: 09/04/2021   History of Present Illness Brittany Weeks is a 47yoF who comes [facility] on 6/14 after a fall at home while gardening. PMH: insomnia, tobacco use, HLD. Workup revealing of Rt femur fracture. Pt underwent IM nail fixation on 6/15 c Dr. Renee Harder. Pt is subsequently WBAT.    PT Comments    Pt in chair with feet down.  Orthostatics taken in sitting and standing and remained stable today.  Does c/o some fuzziness but it could be attributed to pain meds and did not limit mobility today.  Stood with min guard and is able to walk 80' with RW and min guard limited by fatigue.  After seated rest, she is able to go up.down 3 steps with bilateral handrails.  Attemped sideways x 1 step but she did not like it that way.    Pt with overall good progress today and orthostatic BP seems to have resolved.  She remains limited by general fatigued.  Husband in for session and observed gait and stair training.  Given gait belt for home.  Given improvement in mobility and orthostatics, discharge plan has been updated to [place] per pt and husband preference.  They stated discharge is planned for home tomorrow.   Recommendations for follow up therapy are one component of a multi-disciplinary discharge planning process, led by the attending physician.  Recommendations may be updated based on patient status, additional functional criteria and insurance authorization.  Follow Up Recommendations  Home health PT     Assistance Recommended at Discharge Frequent or constant Supervision/Assistance  Patient can return home with the following A little help with walking and/or transfers;A little help with bathing/dressing/bathroom;Assistance with cooking/housework;Assist for transportation;Help with stairs or ramp for entrance   Equipment Recommendations  Rolling walker (2  wheels);BSC/3in1;Wheelchair (measurements PT);Wheelchair cushion (measurements PT)    Recommendations for Other Services       Precautions / Restrictions Precautions Precautions: Fall;Posterior Hip Restrictions Weight Bearing Restrictions: Yes RLE Weight Bearing: Weight bearing as tolerated     Mobility  Bed Mobility Overal bed mobility: Needs Assistance Bed Mobility: Supine to Sit     Supine to sit: Min assist, HOB elevated          Transfers Overall transfer level: Needs assistance Equipment used: Rolling walker (2 wheels) Transfers: Sit to/from Stand Sit to Stand: Min guard   Step pivot transfers: Min guard            Ambulation/Gait Ambulation/Gait assistance: Counsellor (Feet): 80 Feet Assistive device: Rolling walker (2 wheels) Gait Pattern/deviations: Step-to pattern, Antalgic, Decreased stride length, Decreased step length - left, Decreased stance time - right Gait velocity: decreased     General Gait Details: progressed well today   Stairs Stairs: Yes Stairs assistance: Min guard Stair Management: Step to pattern Number of Stairs: 4 General stair comments: overall does well.   Wheelchair Mobility    Modified Rankin (Stroke Patients Only)       Balance Overall balance assessment: Needs assistance Sitting-balance support: No upper extremity supported, Feet supported Sitting balance-Leahy Scale: Good     Standing balance support: Bilateral upper extremity supported, During functional activity Standing balance-Leahy Scale: Fair Standing balance comment: no LOB with UE support on RW                            Cognition Arousal/Alertness: Awake/alert  Behavior During Therapy: WFL for tasks assessed/performed Overall Cognitive Status: Within Functional Limits for tasks assessed                                          Exercises      General Comments        Pertinent Vitals/Pain Pain  Assessment Pain Assessment: Faces Faces Pain Scale: Hurts a little bit Pain Location: R LE Pain Descriptors / Indicators: Aching, Operative site guarding Pain Intervention(s): Monitored during session, Premedicated before session    Home Living                          Prior Function            PT Goals (current goals can now be found in the care plan section) Progress towards PT goals: Progressing toward goals    Frequency    BID      PT Plan Discharge plan needs to be updated    Co-evaluation              AM-PAC PT "6 Clicks" Mobility   Outcome Measure  Help needed turning from your back to your side while in a flat bed without using bedrails?: A Little Help needed moving from lying on your back to sitting on the side of a flat bed without using bedrails?: A Little Help needed moving to and from a bed to a chair (including a wheelchair)?: A Little Help needed standing up from a chair using your arms (e.g., wheelchair or bedside chair)?: A Little Help needed to walk in hospital room?: A Little Help needed climbing 3-5 steps with a railing? : A Little 6 Click Score: 18    End of Session Equipment Utilized During Treatment: Gait belt Activity Tolerance: Treatment limited secondary to medical complications (Comment) Patient left: in chair;with call bell/phone within reach;with chair alarm set;with family/visitor present Nurse Communication: Mobility status;Other (comment) PT Visit Diagnosis: Difficulty in walking, not elsewhere classified (R26.2);Other abnormalities of gait and mobility (R26.89);Muscle weakness (generalized) (M62.81);History of falling (Z91.81)     Time: 7588-3254 PT Time Calculation (min) (ACUTE ONLY): 27 min  Charges:  $Gait Training: 8-22 mins $Therapeutic Activity: 8-22 mins                   Chesley Noon, PTA 09/04/21, 10:56 AM

## 2021-09-04 NOTE — Plan of Care (Signed)
  Problem: Education: Goal: Knowledge of General Education information will improve Description Including pain rating scale, medication(s)/side effects and non-pharmacologic comfort measures Outcome: Progressing   Problem: Health Behavior/Discharge Planning: Goal: Ability to manage health-related needs will improve Outcome: Progressing   

## 2021-09-04 NOTE — Progress Notes (Signed)
Subjective: Ms. Micallef is postop day 4 from right femur intramedullary nail placement.  She is currently at the bedside chair reading.  She is having minimal pain at rest.  Objective: Vital signs in last 24 hours: Temp:  [97.7 F (36.5 C)-99.3 F (37.4 C)] 99.3 F (37.4 C) (06/18 2036) Pulse Rate:  [83-87] 87 (06/18 2036) Resp:  [14-16] 16 (06/18 2036) BP: (117-134)/(49-54) 134/54 (06/18 2036) SpO2:  [97 %-98 %] 97 % (06/18 2036)  Intake/Output from previous day: 06/18 0701 - 06/19 0700 In: -  Out: 2 [Urine:1; Stool:1] Intake/Output this shift: No intake/output data recorded.  General: Alert, comfortable Right lower extremity is grossly motor and sensory intact.  Dressings are clean, dry, intact.  Lab Results Recent Labs    09/03/21 0349 09/04/21 0542  WBC 9.8 8.6  HGB 8.9* 8.8*  HCT 27.5* 27.3*    Liver Panel No results for input(s): "PROT", "ALBUMIN", "AST", "ALT", "ALKPHOS", "BILITOT", "BILIDIR", "IBILI" in the last 72 hours.  Sedimentation Rate No results for input(s): "ESRSEDRATE" in the last 72 hours. C-Reactive Protein No results for input(s): "CRP" in the last 72 hours.  Microbiology: No results found for this or any previous visit (from the past 240 hour(s)).  Studies/Results: No results found.  Medications: Reviewed  Assessment/Plan: Ms. Mariona Scholes is 4 day postop from right femur intramedullary nail placement  POSTOPERATIVE PLAN: She will be weightbearing as tolerated.  Current PT recs are for SNF although patient would like to go home. DVT ppx: aspirin 325 mg twice daily Dressing change by nursing staff as needed to keep dressing clean and dry Outpatient f/u in clinic in 2 weeks for staple removal and xrays  LOS: 5 days    Renee Harder  09/04/2021, 7:57 AM

## 2021-09-04 NOTE — Discharge Summary (Signed)
Physician Discharge Summary   Patient: Brittany Weeks MRN: [id number] DOB: 1950-11-01  Admit date:     08/30/2021  Discharge date: 09/04/21  Discharge Physician: Lorella Nimrod   PCP: Pcp, No   Recommendations at discharge:  Please obtain CBC and BMP in 1 week Follow-up with primary care provider within a week Follow-up with orthopedic surgery in 2 weeks  Discharge Diagnoses: Principal Problem:   Right femoral fracture ([place]) Active Problems:   Major depression, recurrent, full remission ([place])   Acute postoperative anemia due to expected blood loss   Tobacco abuse   Insomnia   Postural dizziness   Hospital Course: Brittany Weeks is a 71 year old female with history of insomnia, presents emergency department for chief concerns of for fall.  Initial vitals in the emergency department showed temperature 98.3, respiration rate of 19, heart rate 105 and improved to 96, blood pressure 123/61, SPO2 95% on room air.  Serum sodium 140, potassium 2.8, chloride 112, bicarb 23, BUN of 16, serum creatinine of 2.66, nonfasting blood glucose 157, GFR greater than 60, WBC 10.5, hemoglobin 12.3, platelets of 248. Right hip imaging shows displaced, cumin noted, impacted subtrochanteric fracture of the proximal right femur. Right humerus and forearm x-rays were without any acute abnormality. Chest x-ray which was initially read as pneumothorax but found to be a skin tag and labeled as negative for any significant abnormality.  ED treatment: Ondansetron 4 mg IV one-time dose.  Morphine 4 mg IV.  Orthopedic was consulted and she will be going to the OR today.  6/16: S/p ORIF on 08/31/2021.  Tolerated the procedure well. Patient was becoming little dizzy with some drop in blood pressure during changing position while working with PT.  She was feeling weak.  Hemoglobin dropped to 10.5 from 12.1 preoperatively, most likely intraoperative blood loss.  Giving some IV fluid.  Also encouraging p.o.  hydration Patient still wants to go home with home health services. Orthopedic is recommending full dose aspirin twice daily for postoperative DVT prophylaxis.  6/17: Patient continued to experience dizziness with walking, orthostatic vitals were positive.  She still wants to go home instead of SNF.  Giving IV bolus PT is recommending SNF.  6/18: Hemoglobin dropped to 8.9 this morning, blood pressure remained on softer side.  Orthostatic vitals with some drop after standing, otherwise much improved.  Continued to have dizziness with ambulation, giving some more IV fluid.  Husband is requesting wheelchair and he is trying to get the ramp to get in the house.  6/19: Hemoglobin stable at 8.8 this morning.  Continued to have positive orthostatic vitals but able to work with PT and OT today.  She was instructed to keep herself well-hydrated.  Wheelchair and 3 and 1 was ordered.  Home health services ordered as patient does not want to go to rehab place. She was also provided with some pain medications to be used only as needed.  Patient need to have a close follow-up with primary care provider and orthopedic surgeon for further recommendations.  Assessment and Plan: * Right femoral fracture ([place])  Presumed secondary to mechanical fall in setting of pants being too long S/p ORIF.  Tolerated the procedure well. -PT is recommending SNF but patient wants to go home with home health services -Full dose of aspirin twice daily aspirin for DVT prophylaxis per orthopedic surgery -Continue with supportive care  Major depression, recurrent, full remission ([place]) - With insomnia - Trazodone 100 mg nightly  Acute postoperative anemia due to  expected blood loss Hemoglobin dropped to 10.0>>8.9 from 12.1 after the surgery.  No obvious bleeding except during surgery. -Continue iron supplement -Continue to monitor  Hyperlipidemia-resolved as of 08/31/2021 Not on any meds at home  Tobacco abuse - As needed  nicotine patch ordered  Insomnia - Resume trazodone 100 mg nightly  Postural dizziness Most likely secondary to decreased intravascular volume as she was having positive orthostatic vitals yesterday, some improvement in orthostasis today but she remained pretty dizzy with ambulation. -Give her 1 more liter of IV fluid. -Continue to monitor   Pain control - [place] was reviewed. and patient was instructed, not to drive, operate heavy machinery, perform activities at heights, swimming or participation in water activities or provide baby-sitting services while on Pain, Sleep and Anxiety Medications; until their outpatient Physician has advised to do so again. Also recommended to not to take more than prescribed Pain, Sleep and Anxiety Medications.  Consultants: Orthopedic surgery Procedures performed: ORIF Disposition: Home health Diet recommendation:  Discharge Diet Orders (From admission, onward)     Start     Ordered   09/04/21 0000  Diet - low sodium heart healthy        09/04/21 1309           Regular diet DISCHARGE MEDICATION: Allergies as of 09/04/2021       Reactions   Epoxytropine  [scopolamine] Nausea Only   dizziness   Other    Other reaction(s): Cough Balsam of [place] and black rubber mix- pt states black rubber, "makes my skin split."   Nickel Rash        Medication List     STOP taking these medications    mupirocin ointment 2 % Commonly known as: BACTROBAN       TAKE these medications    aspirin EC 325 MG tablet Take 1 tablet (325 mg total) by mouth 2 (two) times daily.   docusate sodium 100 MG capsule Commonly known as: COLACE Take 1 capsule (100 mg total) by mouth 2 (two) times daily.   ferrous [id number] C-folic acid capsule Commonly known as: TRINSICON / FOLTRIN Take 1 capsule by mouth 3 (three) times daily after meals.   HYDROcodone-acetaminophen 5-325 MG tablet Commonly  known as: NORCO/VICODIN Take 1-2 tablets by mouth every 4 (four) hours as needed for moderate pain (pain score 4-6).   polyethylene glycol 17 g packet Commonly known as: MIRALAX / GLYCOLAX Take 17 g by mouth daily as needed for mild constipation.   traZODone 100 MG tablet Commonly known as: DESYREL Take 1 tablet (100 mg total) by mouth at bedtime.               Durable Medical Equipment  (From admission, onward)           Start     Ordered   09/04/21 1301  For home use only DME lightweight manual wheelchair with seat cushion  Once       Comments: Patient suffers from femur fracture which impairs their ability to perform daily activities like ADLS in the home.  A walking aide will not resolve  issue with performing activities of daily living. A wheelchair will allow patient to safely perform daily activities. Patient is not able to propel themselves in the home using a standard weight wheelchair due to weakness. Patient can self propel in the lightweight wheelchair. Length of need 6 months Accessories: elevating leg rests (ELRs), wheel locks, extensions and anti-tippers. Back cushion  09/04/21 1301   09/03/21 1250  For home use only DME lightweight manual wheelchair with seat cushion  Once       Comments: Patient suffers from right femoral fracture which impairs their ability to perform daily activities like bathing, dressing, and toileting in the home.  A walker will not resolve  issue with performing activities of daily living. A wheelchair will allow patient to safely perform daily activities. Patient is not able to propel themselves in the home using a standard weight wheelchair due to general weakness. Patient can self propel in the lightweight wheelchair. Length of need 6 months . Accessories: elevating leg rests (ELRs), wheel locks, extensions and anti-tippers.   09/03/21 1250   09/01/21 0942  For home use only DME Walker rolling  Once       Question Answer Comment   Walker: With [place]   Patient needs a walker to treat with the following condition Difficulty walking      09/01/21 0942              Discharge Care Instructions  (From admission, onward)           Start     Ordered   09/04/21 0000  Leave dressing on - Keep it clean, dry, and intact until clinic visit        09/04/21 1309            Follow-up Information     Renee Harder, MD. Schedule an appointment as soon as possible for a visit in 2 week(s).   Specialty: Orthopedic Surgery Contact information: [place] [address] [phone]                Discharge Exam: Filed Weights   08/30/21 1742  Weight: 70.2 kg   General.     In no acute distress. Pulmonary.  Lungs clear bilaterally, normal respiratory effort. CV.  Regular rate and rhythm, no JVD, rub or murmur. Abdomen.  Soft, nontender, nondistended, BS positive. CNS.  Alert and oriented .  No focal neurologic deficit. Extremities.  No edema, no cyanosis, pulses intact and symmetrical. Psychiatry.  Judgment and insight appears normal.   Condition at discharge: stable  The results of significant diagnostics from this hospitalization (including imaging, microbiology, ancillary and laboratory) are listed below for reference.   Imaging Studies: DG HIP UNILAT WITH PELVIS 2-3 VIEWS RIGHT  Result Date: 08/31/2021 CLINICAL DATA:  Right-sided intramedullary nail. EXAM: DG HIP (WITH OR WITHOUT PELVIS) 2-3V RIGHT COMPARISON:  Preoperative hip radiograph 08/30/2021 FINDINGS: Five fluoroscopic spot views of the right hip and femur obtained in the operating room. Long intramedullary nail with trans trochanteric and distal locking screws traverse proximal femur fracture. Improved alignment from preoperative imaging. Fluoroscopy time 1 minutes 41 seconds. Dose 15.5 mGy. IMPRESSION: Intraoperative fluoroscopy during right femur fracture ORIF. Electronically Signed   By: Keith Rake  M.D.   On: 08/31/2021 19:17   DG C-Arm 1-60 Min-No Report  Result Date: 08/31/2021 Fluoroscopy was utilized by the requesting physician.  No radiographic interpretation.   DG C-Arm 1-60 Min-No Report  Result Date: 08/31/2021 Fluoroscopy was utilized by the requesting physician.  No radiographic interpretation.   DG Chest Portable 1 View  Result Date: 08/30/2021 CLINICAL DATA:  Repeat chest radiograph to exclude pneumothorax. EXAM: PORTABLE CHEST 1 VIEW COMPARISON:  Same-day chest radiograph. FINDINGS: The cardiomediastinal silhouette is normal. There is no focal consolidation or pulmonary edema. There is no pneumothorax. The finding on the  prior radiograph likely reflect a skin fold. There is no pleural effusion. The bones are stable.  No displaced rib fracture is seen. IMPRESSION: No pneumothorax. The finding on the prior radiograph likely reflected a skin fold. Electronically Signed   By: Valetta Mole M.D.   On: 08/30/2021 15:15   DG Chest 1 View  Addendum Date: 08/30/2021   ADDENDUM REPORT: 08/30/2021 14:48 ADDENDUM: These results were called by telephone at the time of interpretation on 08/30/2021 at 2:48 pm to provider Merlyn Lot , who verbally acknowledged these results. Electronically Signed   By: Zetta Bills M.D.   On: 08/30/2021 14:48   Result Date: 08/30/2021 CLINICAL DATA:  71 year old female presenting with fall landing on RIGHT side, reported audible crack upon landing. EXAM: CHEST  1 VIEW COMPARISON:  None available. FINDINGS: EKG leads project over the chest. Cardiomediastinal contours and hilar structures are normal. Lucency over the RIGHT upper chest towards the RIGHT lung apex. Lung markings can be seen passing peripheral to this area, this is favored to represent a skin fold. However, this cannot be followed beyond the pleural space on the frontal projection. No signs of lobar consolidation.  No evidence of pleural effusion. On limited assessment there is no acute  skeletal finding. IMPRESSION: Lucency over the RIGHT upper chest towards the RIGHT lung apex. Lung markings can be seen peripherally though the area cannot be followed outside of the pleural space. While this is favored to represent a skin fold would suggest a repeat frontal view of the chest in expiration to exclude the less likely possibility of small pneumothorax on the RIGHT. Call is out to the referring provider to further discuss findings in the above case. Electronically Signed: By: Zetta Bills M.D. On: 08/30/2021 14:41   DG Hip Unilat  With Pelvis 2-3 Views Right  Result Date: 08/30/2021 CLINICAL DATA:  Fall, pain EXAM: DG HIP (WITH OR WITHOUT PELVIS) 2-3V RIGHT COMPARISON:  None Available. FINDINGS: Displaced, comminuted, impacted subtrochanteric fractures of the proximal right femur. The pelvis and proximal left femur appear intact in single frontal view. IMPRESSION: Displaced, comminuted, impacted subtrochanteric fractures of the proximal right femur. Electronically Signed   By: Delanna Ahmadi M.D.   On: 08/30/2021 14:36   DG Humerus Right  Result Date: 08/30/2021 CLINICAL DATA:  Fall, pain, initial encounter. EXAM: RIGHT HUMERUS - 2+ VIEW COMPARISON:  None. FINDINGS: No acute osseous abnormality. Degenerative changes in the right acromioclavicular joint. IMPRESSION: No acute findings. Electronically Signed   By: Lorin Picket M.D.   On: 08/30/2021 14:35   DG Forearm Right  Result Date: 08/30/2021 CLINICAL DATA:  Fall, pain, initial encounter. EXAM: RIGHT FOREARM - 2 VIEW COMPARISON:  None. FINDINGS: No acute osseous abnormality. IMPRESSION: No acute osseous abnormality. Electronically Signed   By: Lorin Picket M.D.   On: 08/30/2021 14:34    Microbiology: Results for orders placed or performed during the hospital encounter of 11/04/19  SARS CORONAVIRUS 2 (TAT 6-24 HRS) Nasopharyngeal Nasopharyngeal Swab     Status: None   Collection Time: 11/04/19 10:19 AM   Specimen:  Nasopharyngeal Swab  Result Value Ref Range Status   SARS Coronavirus 2 NEGATIVE NEGATIVE Final    Comment: (NOTE) SARS-CoV-2 target nucleic acids are NOT DETECTED.  The SARS-CoV-2 RNA is generally detectable in upper and lower respiratory specimens during the acute phase of infection. Negative results do not preclude SARS-CoV-2 infection, do not rule out co-infections with other pathogens, and should not be used as the sole  basis for treatment or other patient management decisions. Negative results must be combined with clinical observations, patient history, and epidemiological information. The expected result is Negative.  Fact Sheet for Patients: SugarRoll.be  Fact Sheet for Healthcare Providers: https://www.woods-mathews.com/  This test is not yet approved or cleared by the [place] FDA and  has been authorized for detection and/or diagnosis of SARS-CoV-2 by FDA under an Emergency Use Authorization (EUA). This EUA will remain  in effect (meaning this test can be used) for the duration of the COVID-19 declaration under Se ction 564(b)(1) of the Act, 21 U.S.C. section 360bbb-3(b)(1), unless the authorization is terminated or revoked sooner.  Performed at [facility], [place] [address], [place], [place] [address]     Labs: CBC: Recent Labs  Lab 08/31/21 [phone] 09/01/21 0543 09/02/21 0414 09/03/21 0349 09/04/21 0542  WBC 11.0* 12.7* 11.7* 9.8 8.6  HGB 12.1 10.5* 10.0* 8.9* 8.8*  HCT 37.7 32.3* 31.1* 27.5* 27.3*  MCV 98.2 97.3 98.4 97.5 97.2  PLT 248 231 229 231 063   Basic Metabolic Panel: Recent Labs  Lab 08/30/21 1332 08/31/21 0337 09/01/21 0543  NA 140 140 139  K 3.8 4.0 4.1  CL 112* 110 108  CO2 '23 26 25  '$ GLUCOSE 157* 141* 119*  BUN '16 11 10  '$ CREATININE 0.66 0.59 0.51  CALCIUM 8.6* 8.8* 8.3*   Liver Function Tests: Recent Labs  Lab 08/30/21 1332  AST 15  ALT 14  ALKPHOS 55  BILITOT 0.5  PROT  6.2*  ALBUMIN 3.4*   CBG: No results for input(s): "GLUCAP" in the last 168 hours.  Discharge time spent: greater than 30 minutes. This record has been created using Systems analyst. Errors have been sought and corrected,but may not always be located. Such creation errors do not reflect on the standard of care.   Signed: Lorella Nimrod, MD [facility] 09/04/2021

## 2021-09-05 ENCOUNTER — Telehealth: Payer: Self-pay

## 2021-09-05 DIAGNOSIS — F32A Depression, unspecified: Secondary | ICD-10-CM | POA: Insufficient documentation

## 2021-09-05 DIAGNOSIS — R3129 Other microscopic hematuria: Secondary | ICD-10-CM | POA: Insufficient documentation

## 2021-09-05 DIAGNOSIS — N393 Stress incontinence (female) (male): Secondary | ICD-10-CM | POA: Insufficient documentation

## 2021-09-05 NOTE — Telephone Encounter (Signed)
Transition Care Management Follow-up Telephone Call Date of discharge and from where: [facility] 09/04/21 How have you been since you were released from the hospital? Doing ok, leg is a bit swollen Any questions or concerns? No  Items Reviewed: Did the pt receive and understand the discharge instructions provided? Yes  Medications obtained and verified? Yes  Other? No  Any new allergies since your discharge? No  Dietary orders reviewed? No Do you have support at home? Yes   Home Care and Equipment/Supplies: Were home health services ordered? no   Functional Questionnaire: (I = Independent and D = Dependent) ADLs: D   Bathing/Dressing- D  Meal Prep- D  Eating- I  Maintaining continence- I  Transferring/Ambulation- D  Managing Meds- I  Follow up appointments reviewed:  PCP Hospital f/u appt confirmed? No  husband refused to make a hospital f/u appointment- . [facility] f/u appt confirmed? No   Are transportation arrangements needed? No  If their condition worsens, is the pt aware to call PCP or go to the Emergency Dept.? Yes Was the patient provided with contact information for the PCP's office or ED? Yes Was to pt encouraged to call back with questions or concerns? Yes

## 2021-09-06 ENCOUNTER — Ambulatory Visit: Payer: Self-pay | Admitting: *Deleted

## 2021-09-06 NOTE — Telephone Encounter (Signed)
  Chief Complaint: Leg swelling Symptoms: Right leg swelling S/P Intramedullary nailed, fx 09/01/21 States swelling increased last few days and left leg swelling as well. Pt was calling to secure Follow up appt states "Brittany Weeks called to schedule me but I didn't schedule at that time."  Frequency: Worsening few days Pertinent Negatives: Patient denies SOB fever, redness warmth Disposition: '[]'$ ED /'[]'$ Urgent Care (no appt availability in office) / '[]'$ Appointment(In office/virtual)/ '[]'$  [facility]/ '[]'$ Home Care/ '[]'$ Refused Recommended Disposition /'[]'$ [facility]/ '[x]'$  Follow-up with PCP Additional Notes: States they have tried to reach surgeon, has not called back as of yet. NT called practice for consult, Rachell, advised to reach out to surgeon, no availability at practice until next week. Advised ED for worsening symptoms. Pt verbalizes understanding.

## 2021-09-06 NOTE — Telephone Encounter (Signed)
Reason for Disposition  [1] MODERATE leg swelling (e.g., swelling extends up to knees) AND [2] new-onset or worsening  Answer Assessment - Initial Assessment Questions 1. ONSET: "When did the swelling start?" (e.g., minutes, hours, days)     Closed displaced subtrochanteric fracture of right femur, nailing 09/01/21 2. LOCATION: "What part of the leg is swollen?"  "Are both legs swollen or just one leg?"     Operative leg swelling "Bad"  other leg now swelling 3. SEVERITY: "How bad is the swelling?" (e.g., localized; mild, moderate, severe)  - Localized - small area of swelling localized to one leg  - MILD pedal edema - swelling limited to foot and ankle, pitting edema < 1/4 inch (6 mm) deep, rest and elevation eliminate most or all swelling  - MODERATE edema - swelling of lower leg to knee, pitting edema > 1/4 inch (6 mm) deep, rest and elevation only partially reduce swelling  - SEVERE edema - swelling extends above knee, facial or hand swelling present      Moderate-severe 4. REDNESS: "Does the swelling look red or infected?"     no 5. PAIN: "Is the swelling painful to touch?" If Yes, ask: "How painful is it?"   (Scale 1-10; mild, moderate or severe)     Post op 6. FEVER: "Do you have a fever?" If Yes, ask: "What is it, how was it measured, and when did it start?"      no 7. CAUSE: "What do you think is causing the leg swelling?"     Surgery 8. MEDICAL HISTORY: "Do you have a history of heart failure, kidney disease, liver failure, or cancer?"      9. RECURRENT SYMPTOM: "Have you had leg swelling before?" If Yes, ask: "When was the last time?" "What happened that time?"      10. OTHER SYMPTOMS: "Do you have any other symptoms?" (e.g., chest pain, difficulty breathing)       Post op pain  Protocols used: Leg Swelling and Edema-A-AH

## 2021-09-07 ENCOUNTER — Other Ambulatory Visit: Payer: Self-pay | Admitting: Orthopaedic Surgery

## 2021-09-07 ENCOUNTER — Ambulatory Visit
Admission: RE | Admit: 2021-09-07 | Discharge: 2021-09-07 | Disposition: A | Discharge status: Home / Self Care | Payer: Medicare Other | Source: Ambulatory Visit | Attending: Orthopaedic Surgery | Admitting: Orthopaedic Surgery

## 2021-09-07 DIAGNOSIS — S7291XA Unspecified fracture of right femur, initial encounter for closed fracture: Secondary | ICD-10-CM

## 2021-09-07 DIAGNOSIS — R600 Localized edema: Secondary | ICD-10-CM | POA: Diagnosis not present

## 2021-09-07 NOTE — Telephone Encounter (Signed)
She is due for follow-up.  For this particular issue, since it is directly related to orthopedic surgery, unfortunately there is not much else I can do over the phone or on my end.  She should follow up with Surgeons and if need hospital ED eval for a post-op setting.  Brittany Weeks, [facility] 09/07/2021, 11:17 AM

## 2021-09-20 ENCOUNTER — Telehealth: Payer: Self-pay

## 2021-09-20 DIAGNOSIS — R7309 Other abnormal glucose: Secondary | ICD-10-CM

## 2021-09-20 DIAGNOSIS — E7800 Pure hypercholesterolemia, unspecified: Secondary | ICD-10-CM

## 2021-09-20 DIAGNOSIS — Z0000 Encounter for general adult medical examination without abnormal findings: Secondary | ICD-10-CM

## 2021-09-20 DIAGNOSIS — F3342 Major depressive disorder, recurrent, in full remission: Secondary | ICD-10-CM

## 2021-09-20 NOTE — Telephone Encounter (Signed)
Copied from [place] [id number]. Topic: General - Other >> Sep 20, 2021  8:48 AM Brittany Weeks I wrote: Reason for CRM: pt called and stated that she would like lab work done prior to appointment on physical appointment.  11/03/21 please advise

## 2021-09-20 NOTE — Telephone Encounter (Signed)
Okay. She is scheduled already for physical on 11/03/21.  Please schedule her for a Fasting Lab Only apt to get labs drawn 1 week prior to her apt.  The orders are already in the system.  Nobie Putnam, [facility] 09/20/2021, 5:36 PM

## 2021-09-21 NOTE — Telephone Encounter (Signed)
I called and spoke with the patient. She is scheduled to come in on 10/27/21 for labs and will follow up with her CPE on 11/03/21. Labs have been ordered and linked to the lab appt.

## 2021-09-25 DIAGNOSIS — S7291XA Unspecified fracture of right femur, initial encounter for closed fracture: Secondary | ICD-10-CM | POA: Diagnosis not present

## 2021-10-27 ENCOUNTER — Other Ambulatory Visit: Payer: Medicare Other

## 2021-10-27 DIAGNOSIS — E7800 Pure hypercholesterolemia, unspecified: Secondary | ICD-10-CM

## 2021-10-27 DIAGNOSIS — R7309 Other abnormal glucose: Secondary | ICD-10-CM | POA: Diagnosis not present

## 2021-10-27 DIAGNOSIS — Z0000 Encounter for general adult medical examination without abnormal findings: Secondary | ICD-10-CM | POA: Diagnosis not present

## 2021-10-28 LAB — COMPLETE METABOLIC PANEL WITH GFR
AG Ratio: 1.5 (calc) (ref 1.0–2.5)
ALT: 19 U/L (ref 6–29)
AST: 17 U/L (ref 10–35)
Albumin: 4.2 g/dL (ref 3.6–5.1)
Alkaline phosphatase (APISO): 154 U/L — ABNORMAL HIGH (ref 37–153)
BUN/Creatinine Ratio: 8 (calc) (ref 6–22)
BUN: 6 mg/dL — ABNORMAL LOW (ref 7–25)
CO2: 27 mmol/L (ref 20–32)
Calcium: 9.5 mg/dL (ref 8.6–10.4)
Chloride: 102 mmol/L (ref 98–110)
Creat: 0.72 mg/dL (ref 0.60–1.00)
Globulin: 2.8 g/dL (calc) (ref 1.9–3.7)
Glucose, Bld: 93 mg/dL (ref 65–99)
Potassium: 4.3 mmol/L (ref 3.5–5.3)
Sodium: 138 mmol/L (ref 135–146)
Total Bilirubin: 0.3 mg/dL (ref 0.2–1.2)
Total Protein: 7 g/dL (ref 6.1–8.1)
eGFR: 89 mL/min/{1.73_m2} (ref >=60)

## 2021-10-28 LAB — CBC WITH DIFFERENTIAL/PLATELET
Absolute Monocytes: 497 cells/uL (ref 200–950)
Basophils Absolute: 64 cells/uL (ref 0–200)
Basophils Relative: 0.7 %
Eosinophils Absolute: 212 cells/uL (ref 15–500)
Eosinophils Relative: 2.3 %
HCT: 42.1 % (ref 35.0–45.0)
Hemoglobin: 13.8 g/dL (ref 11.7–15.5)
Lymphs Abs: 2696 cells/uL (ref 850–3900)
MCH: 32.5 pg (ref 27.0–33.0)
MCHC: 32.8 g/dL (ref 32.0–36.0)
MCV: 99.3 fL (ref 80.0–100.0)
MPV: 10.5 fL (ref 7.5–12.5)
Monocytes Relative: 5.4 %
Neutro Abs: 5732 cells/uL (ref 1500–7800)
Neutrophils Relative %: 62.3 %
Platelets: 378 10*3/uL (ref 140–400)
RBC: 4.24 10*6/uL (ref 3.80–5.10)
RDW: 12 % (ref 11.0–15.0)
Total Lymphocyte: 29.3 %
WBC: 9.2 10*3/uL (ref 3.8–10.8)

## 2021-10-28 LAB — LIPID PANEL
Cholesterol: 160 mg/dL (ref <200)
HDL: 62 mg/dL (ref >=50)
LDL Cholesterol (Calc): 77 mg/dL (calc)
Non-HDL Cholesterol (Calc): 98 mg/dL (calc) (ref <130)
Total CHOL/HDL Ratio: 2.6 (calc) (ref <5.0)
Triglycerides: 128 mg/dL (ref <150)

## 2021-10-28 LAB — HEMOGLOBIN A1C
Hgb A1c MFr Bld: 5.2 % of total Hgb (ref <5.7)
Mean Plasma Glucose: 103 mg/dL
eAG (mmol/L): 5.7 mmol/L

## 2021-10-28 LAB — TSH: TSH: 0.8 mIU/L (ref 0.40–4.50)

## 2021-11-02 ENCOUNTER — Other Ambulatory Visit: Payer: Self-pay | Admitting: Family Medicine

## 2021-11-02 DIAGNOSIS — Z1231 Encounter for screening mammogram for malignant neoplasm of breast: Secondary | ICD-10-CM

## 2021-11-03 ENCOUNTER — Ambulatory Visit (INDEPENDENT_AMBULATORY_CARE_PROVIDER_SITE_OTHER): Payer: Medicare Other | Admitting: Family Medicine

## 2021-11-03 ENCOUNTER — Encounter: Payer: Self-pay | Admitting: Orthopaedic Surgery

## 2021-11-03 VITALS — BP 118/63 | HR 61 | Ht 61.0 in | Wt 141.4 lb

## 2021-11-03 DIAGNOSIS — Z0000 Encounter for general adult medical examination without abnormal findings: Secondary | ICD-10-CM

## 2021-11-03 DIAGNOSIS — Z780 Asymptomatic menopausal state: Secondary | ICD-10-CM

## 2021-11-03 DIAGNOSIS — F3342 Major depressive disorder, recurrent, in full remission: Secondary | ICD-10-CM | POA: Diagnosis not present

## 2021-11-03 MED ORDER — TRAZODONE HCL 100 MG PO TABS: 100.0000 mg | ORAL_TABLET | Freq: Every day | ORAL | 3 refills | Status: DC

## 2021-11-03 NOTE — Progress Notes (Unsigned)
Subjective:    Patient ID: Brittany Weeks, female    DOB: [birth year], 71 y.o.   MRN: [id number]  Brittany Weeks is a 71 y.o. 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 Doing well on Trazodone $RemoveBefo'100mg'HHWcIsLSJhj$  nightly still  ***   HYPERLIPIDEMIA: Last lipid 10/2021, significantly improved to 77 from previous 106 Declines statin therapy again Admits improved healthy diet, except does eat dessert often   Tobacco Abuse Chronic history smoker 1ppd >50 years. Not interested in quitting. Has not had lung CA screening with CT, not interested at this time.   Dr Richardson [facility] - for hearing eval 11/08/21  Anemia, normocytic Hemoglobin up to 13.8, previously as low as 8.8 post-op. She ate more chicken liver during the past 6 weeks   Lower Extremity Edema ***Defer vascularl for now. Swelling post op  ***Ortho Dr Sharlet Salina Monday 8/21 ASA $Remove'325mg'hVbwMuL$  BID  Health Maintenance:   Cervical Cancer Screening - no longer due, s/p last pap at age 35 negative, declines further screening   Not updated on COVID19 vaccine.   Colonoscopy last updated 11/06/19. Had hyperplastic polyp. Good for 10 years. [facility] next 2031 if interested   Breast CA Screening: Due for mammogram screening. Last mammogram result normal (09/01/19). Known family history with 23 year old daughter with breast cancer. Currently asymptomatic. - Scheduled 11/27/21 mammogram scheduled   Last DEXA 2014, she was previously treated with Fosamax back in 2004. Now declines further treatment or testing. Now with hip fracture, she would like to re order DEXA.  ***FUTURE Plan  to refer to Endocrine Osteoporosis      11/03/2021    1:37 PM 10/12/2020   10:52 AM 10/08/2019   11:10 AM  Depression screen PHQ 2/9  Decreased Interest 0 0 0  Down, Depressed, Hopeless 0 0 0  PHQ - 2 Score 0 0 0  Altered sleeping 0 0 0  Tired, decreased energy 1 1 0  Change in appetite 0 0 0  Feeling bad or failure about yourself  0 0 0  Trouble concentrating 0 0 0  Moving slowly or fidgety/restless 0 0 0  Suicidal thoughts 0 0 0  PHQ-9 Score 1 1 0  Difficult doing work/chores Not difficult at all Not difficult at all Not difficult at all    Past Medical History:  Diagnosis Date   Allergy    Anxiety    Colon polyps    Depression    Past Surgical History:  Procedure Laterality Date   COLONOSCOPY WITH PROPOFOL N/A 11/06/2019   Procedure: COLONOSCOPY WITH PROPOFOL;  Surgeon: Lesly Rubenstein, MD;  [facility]COPY;  Service: Endoscopy;  Laterality: N/A;   GALLBLADDER SURGERY  1974   INTRAMEDULLARY (IM) NAIL INTERTROCHANTERIC Right 08/31/2021   Procedure: INTRAMEDULLARY (IM) NAIL INTERTROCHANTRIC;  Surgeon: Renee Harder, MD;  Location: [facility] ORS;  Service: Orthopedics;  Laterality: Right;   TONSILLECTOMY Bilateral 1960   Social History   Socioeconomic History   Marital status: Married    Spouse name: Not on file   Number of children: Not on file   Years of education: High School   Highest education level:  High school graduate  Occupational History   Not on file  Tobacco Use   Smoking status: Every Day    Packs/day: 1.00    Years: 50.00    Total pack years: 50.00    Types: Cigarettes   Smokeless tobacco: Current  Vaping Use   Vaping Use: Never used  Substance and Sexual Activity   Alcohol use: Yes    Alcohol/week: 1.0 standard drink of alcohol    Types: 1 Standard drinks or equivalent per week   Drug use: Never   Sexual activity: Not on file  Other Topics Concern   Not on file  Social History Narrative   Not on file   Social Determinants of  Health   Financial Resource Strain: Not on file  Food Insecurity: Not on file  Transportation Needs: Not on file  Physical Activity: Not on file  Stress: Not on file  Social Connections: Not on file  Intimate Partner Violence: Not on file   Family History  Problem Relation Age of Onset   Leukemia Mother    Dementia Father    Breast cancer Daughter    Current Outpatient Medications on File Prior to Visit  Medication Sig   aspirin EC 325 MG tablet Take 1 tablet (325 mg total) by mouth 2 (two) times daily.   polyethylene glycol powder (GLYCOLAX/MIRALAX) 17 GM/SCOOP powder Take 17 g by mouth daily.   No current facility-administered medications on file prior to visit.    Review of Systems Per HPI unless specifically indicated above      Objective:    BP 118/63   Pulse 61   Ht $R'5\' 1"'Vt$  (1.549 m)   Wt 141 lb 6.4 oz (64.1 kg)   LMP  (LMP Unknown)   SpO2 100%   BMI 26.72 kg/m   Wt Readings from Last 3 Encounters:  11/03/21 141 lb 6.4 oz (64.1 kg)  08/30/21 154 lb 12.2 oz (70.2 kg)  10/12/20 153 lb (69.4 kg)    Physical Exam  I have personally reviewed the radiology report from 09/07/21 on Lower Ext US Doppler.  US Venous Img Lower Unilateral Right (DVT) [[id number]] Resulted: 09/07/21 1523  Order Status: Completed Updated: 09/07/21 1525  Narrative:    CLINICAL DATA:  Right lower extremity edema. Recent ORIF for right  hip fracture.   EXAM:  RIGHT LOWER EXTREMITY VENOUS DOPPLER ULTRASOUND   TECHNIQUE:  Gray-scale sonography with graded compression, as well as color  Doppler and duplex ultrasound were performed to evaluate the lower  extremity deep venous systems from the level of the common femoral  vein and including the common femoral, femoral, profunda femoral,  popliteal and calf veins including the posterior tibial, peroneal  and gastrocnemius veins when visible. The superficial great  saphenous vein was also interrogated. Spectral Doppler was utilized  to  evaluate flow at rest and with distal augmentation maneuvers in  the common femoral, femoral and popliteal veins.   COMPARISON:  None Available.   FINDINGS:  Contralateral Common Femoral Vein: Respiratory phasicity is normal  and symmetric with the symptomatic side. No evidence of thrombus.  Normal compressibility.   Common Femoral Vein: No evidence of thrombus. Normal  compressibility, respiratory phasicity and response to augmentation.   Saphenofemoral Junction: No evidence of thrombus. Normal  compressibility and flow on color Doppler imaging.   Profunda Femoral Vein: No evidence of thrombus. Normal  compressibility and flow on color Doppler imaging.   Femoral Vein: No evidence of thrombus. Normal compressibility,  respiratory  phasicity and response to augmentation.   Popliteal Vein: No evidence of thrombus. Normal compressibility,  respiratory phasicity and response to augmentation.   Calf Veins: No evidence of thrombus. Normal compressibility and flow  on color Doppler imaging.   Superficial Great Saphenous Vein: No evidence of thrombus. Normal  compressibility.   Venous Reflux:  None.   Other Findings: No evidence of superficial thrombophlebitis or  abnormal fluid collection.   IMPRESSION:  No evidence of right lower extremity deep venous thrombosis.    Electronically Signed    By: Aletta Edouard M.D.    On: 09/07/2021 15:23     Results for orders placed or performed in visit on 10/27/21  TSH  Result Value Ref Range   TSH 0.80 0.40 - 4.50 mIU/L  Hemoglobin A1c  Result Value Ref Range   Hgb A1c MFr Bld 5.2 <5.7 % of total Hgb   Mean Plasma Glucose 103 mg/dL   eAG (mmol/L) 5.7 mmol/L  Lipid panel  Result Value Ref Range   Cholesterol 160 <200 mg/dL   HDL 62 > OR = 50 mg/dL   Triglycerides 128 <150 mg/dL   LDL Cholesterol (Calc) 77 mg/dL (calc)   Total CHOL/HDL Ratio 2.6 <5.0 (calc)   Non-HDL Cholesterol (Calc) 98 <130 mg/dL (calc)  CBC with  Differential/Platelet  Result Value Ref Range   WBC 9.2 3.8 - 10.8 Thousand/uL   RBC 4.24 3.80 - 5.10 Million/uL   Hemoglobin 13.8 11.7 - 15.5 g/dL   HCT 42.1 35.0 - 45.0 %   MCV 99.3 80.0 - 100.0 fL   MCH 32.5 27.0 - 33.0 pg   MCHC 32.8 32.0 - 36.0 g/dL   RDW 12.0 11.0 - 15.0 %   Platelets 378 140 - 400 Thousand/uL   MPV 10.5 7.5 - 12.5 fL   Neutro Abs 5,732 1,500 - 7,800 cells/uL   Lymphs Abs 2,696 850 - 3,900 cells/uL   Absolute Monocytes 497 200 - 950 cells/uL   Eosinophils Absolute 212 15 - 500 cells/uL   Basophils Absolute 64 0 - 200 cells/uL   Neutrophils Relative % 62.3 %   Total Lymphocyte 29.3 %   Monocytes Relative 5.4 %   Eosinophils Relative 2.3 %   Basophils Relative 0.7 %  COMPLETE METABOLIC PANEL WITH GFR  Result Value Ref Range   Glucose, Bld 93 65 - 99 mg/dL   BUN 6 (L) 7 - 25 mg/dL   Creat 0.72 0.60 - 1.00 mg/dL   eGFR 89 > OR = 60 mL/min/1.55m2   BUN/Creatinine Ratio 8 6 - 22 (calc)   Sodium 138 135 - 146 mmol/L   Potassium 4.3 3.5 - 5.3 mmol/L   Chloride 102 98 - 110 mmol/L   CO2 27 20 - 32 mmol/L   Calcium 9.5 8.6 - 10.4 mg/dL   Total Protein 7.0 6.1 - 8.1 g/dL   Albumin 4.2 3.6 - 5.1 g/dL   Globulin 2.8 1.9 - 3.7 g/dL (calc)   AG Ratio 1.5 1.0 - 2.5 (calc)   Total Bilirubin 0.3 0.2 - 1.2 mg/dL   Alkaline phosphatase (APISO) 154 (H) 37 - 153 U/L   AST 17 10 - 35 U/L   ALT 19 6 - 29 U/L      Assessment & Plan:   Problem List Items Addressed This Visit     Major depression, recurrent, full remission ([place])   Relevant Medications   traZODone (DESYREL) 100 MG tablet   Other Visit Diagnoses     Annual physical  exam    -  Primary   Postmenopausal estrogen deficiency       Relevant Orders   DG Bone Density       Updated Health Maintenance information ***- Reviewed recent lab results with patient Encouraged improvement to lifestyle with diet and exercise -*** Goal of weight loss  Orders Placed This Encounter  Procedures   DG Bone  Density    Standing Status:   Future    Standing Expiration Date:   11/04/2022    Order Specific Question:   Reason for Exam (SYMPTOM  OR DIAGNOSIS REQUIRED)    Answer:   postmenopausal estrogen deficiency, hip fracture recently, history of OP    Order Specific Question:   Preferred imaging location?    Answer:   [facility] ordered this encounter  Medications   traZODone (DESYREL) 100 MG tablet    Sig: Take 1 tablet (100 mg total) by mouth at bedtime.    Dispense:  90 tablet    Refill:  3    Add refills on file      Follow up plan: Return in about 6 months (around 05/06/2022) for 6 month follow-up leg swelling, ortho updates.  Nobie Putnam, [facility] 11/03/2021, 1:54 PM

## 2021-11-03 NOTE — Patient Instructions (Addendum)
Thank you for coming to the office today.  For DEXA Scan (Bone mineral density) screening for osteoporosis  Call the [facility] below anytime to schedule your own appointment now that order has been placed.  [facility], [address] [place], [place] [address] Phone: [phone]  Kidney function, liver function looks good Alkaline phosphatase 154 mild elevated due to bone activity  Hemoglobin 13.8 this has improved. No further anemia. Remain OFF Iron supplement.  Cholesterol has improved to LDL 77, less than 100, excellent.  Refilled Trazodone '100mg'$   Ask Dr Sharlet Salina about Aspirin '325mg'$  twice a day, on Monday, most likely you are done with this at that point. Okay to lower to '81mg'$  daily.  Okay to pause on the vein evaluation. I would recommend revisit this discussion next visit.   Please schedule a Follow-up Appointment to: Return in about 6 months (around 05/06/2022) for 6 month follow-up leg swelling, ortho updates.  If you have any other questions or concerns, please feel free to call the office or send a message through [place]. You may also schedule an earlier appointment if necessary.  Additionally, you may be receiving a survey about your experience at our office within a few days to 1 week by e-mail or mail. We value your feedback.  Nobie Putnam, DO [place]

## 2021-11-06 DIAGNOSIS — S7291XA Unspecified fracture of right femur, initial encounter for closed fracture: Secondary | ICD-10-CM | POA: Diagnosis not present

## 2021-11-08 DIAGNOSIS — H903 Sensorineural hearing loss, bilateral: Secondary | ICD-10-CM | POA: Diagnosis not present

## 2021-11-27 ENCOUNTER — Ambulatory Visit
Admission: RE | Admit: 2021-11-27 | Discharge: 2021-11-27 | Disposition: A | Discharge status: Home / Self Care | Payer: Medicare Other | Source: Ambulatory Visit | Attending: Family Medicine | Admitting: Family Medicine

## 2021-11-27 DIAGNOSIS — Z1231 Encounter for screening mammogram for malignant neoplasm of breast: Secondary | ICD-10-CM | POA: Diagnosis not present

## 2021-11-29 DIAGNOSIS — H905 Unspecified sensorineural hearing loss: Secondary | ICD-10-CM | POA: Diagnosis not present

## 2021-12-05 DIAGNOSIS — H2513 Age-related nuclear cataract, bilateral: Secondary | ICD-10-CM | POA: Diagnosis not present

## 2021-12-19 ENCOUNTER — Ambulatory Visit
Admission: RE | Admit: 2021-12-19 | Discharge: 2021-12-19 | Disposition: A | Discharge status: Home / Self Care | Payer: Medicare Other | Source: Ambulatory Visit | Attending: Family Medicine | Admitting: Family Medicine

## 2021-12-19 DIAGNOSIS — M8589 Other specified disorders of bone density and structure, multiple sites: Secondary | ICD-10-CM | POA: Diagnosis not present

## 2021-12-19 DIAGNOSIS — Z780 Asymptomatic menopausal state: Secondary | ICD-10-CM | POA: Diagnosis not present

## 2022-02-26 ENCOUNTER — Encounter: Payer: Medicare Other | Admitting: Dermatology

## 2022-05-07 DIAGNOSIS — M25551 Pain in right hip: Secondary | ICD-10-CM | POA: Diagnosis not present

## 2022-05-07 DIAGNOSIS — M1711 Unilateral primary osteoarthritis, right knee: Secondary | ICD-10-CM | POA: Diagnosis not present

## 2022-05-14 ENCOUNTER — Other Ambulatory Visit: Payer: Self-pay | Admitting: Family Medicine

## 2022-05-14 ENCOUNTER — Ambulatory Visit (INDEPENDENT_AMBULATORY_CARE_PROVIDER_SITE_OTHER): Payer: Medicare Other | Admitting: Family Medicine

## 2022-05-14 ENCOUNTER — Encounter: Payer: Self-pay | Admitting: Family Medicine

## 2022-05-14 VITALS — BP 112/64 | HR 79 | Ht 61.0 in | Wt 110.8 lb

## 2022-05-14 DIAGNOSIS — E559 Vitamin D deficiency, unspecified: Secondary | ICD-10-CM

## 2022-05-14 DIAGNOSIS — E441 Mild protein-calorie malnutrition: Secondary | ICD-10-CM | POA: Diagnosis not present

## 2022-05-14 DIAGNOSIS — F3342 Major depressive disorder, recurrent, in full remission: Secondary | ICD-10-CM

## 2022-05-14 DIAGNOSIS — E7800 Pure hypercholesterolemia, unspecified: Secondary | ICD-10-CM

## 2022-05-14 DIAGNOSIS — R7309 Other abnormal glucose: Secondary | ICD-10-CM

## 2022-05-14 DIAGNOSIS — R634 Abnormal weight loss: Secondary | ICD-10-CM

## 2022-05-14 DIAGNOSIS — Z720 Tobacco use: Secondary | ICD-10-CM

## 2022-05-14 DIAGNOSIS — Z122 Encounter for screening for malignant neoplasm of respiratory organs: Secondary | ICD-10-CM

## 2022-05-14 DIAGNOSIS — Z0000 Encounter for general adult medical examination without abnormal findings: Secondary | ICD-10-CM

## 2022-05-14 NOTE — Patient Instructions (Addendum)
Thank you for coming to the office today.   Low Dose CT Lung Cancer Screening  [facility] [address] [place], [place] Phone: [phone]  ---------------  DUE for FASTING BLOOD WORK (no food or drink after midnight before the lab appointment, only water or coffee without cream/sugar on the morning of)  SCHEDULE "Lab Only" visit in the morning at the clinic for lab draw in 6 MONTHS   - Make sure Lab Only appointment is at about 1 week before your next appointment, so that results will be available  For Lab Results, once available within 2-3 days of blood draw, you can can log in to MyChart online to view your results and a brief explanation. Also, we can discuss results at next follow-up visit.    Please schedule a Follow-up Appointment to: Return in about 6 months (around 11/12/2022) for 6 month fasting lab only then 1 week later Annual Physical.  If you have any other questions or concerns, please feel free to call the office or send a message through [place]. You may also schedule an earlier appointment if necessary.  Additionally, you may be receiving a survey about your experience at our office within a few days to 1 week by e-mail or mail. We value your feedback.  Nobie Putnam, DO [place]

## 2022-05-14 NOTE — Progress Notes (Signed)
Subjective:    Patient ID: Brittany Weeks, female    DOB: 1950/05/18, 72 y.o.   MRN: [id number]  Brittany Weeks is a 72 y.o. female presenting on 05/14/2022 for Osteoarthritis   HPI  S/p Surgical Repair R Femur closed fracture She has updates from ortho today and copies of x-rays for chart She said swelling in lower extremities post op was worse R>L but both were swollen Fluid retention, \Flare up at times. Previously was more active and functioning without her cane Now worsening, flaring up R hip pain Now walking more much improved edema R>L still present with varicose veins Improved now  Recently had visit with Dr Sharlet Salina, had imaging R Hip and Knee, first time ever since prior fracture. No new complication. 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 Doing well on Trazodone '100mg'$  nightly still   HYPERLIPIDEMIA: Last lab with improved lipid Declines statin therapy again Admits improved healthy diet, except does eat dessert often   Tobacco Abuse Chronic history smoker 1ppd >50 years. Not interested in quitting.  Now interested in LDCT     Lower Extremity Edema Secondary to  surgery Swelling post op   Health Maintenance:   Colonoscopy last updated 11/06/19. Had hyperplastic polyp. Good for 10 years. [facility] next 2031 if interested        05/14/2022    7:01 PM 11/03/2021    1:37 PM 10/12/2020   10:52 AM  Depression screen PHQ 2/9  Decreased Interest 0 0 0  Down, Depressed, Hopeless 0 0 0  PHQ - 2 Score 0 0 0  Altered sleeping 0 0 0  Tired, decreased energy '1 1 1  '$ Change in appetite 0 0 0  Feeling bad or failure about yourself  0 0 0  Trouble concentrating 0 0 0  Moving slowly or fidgety/restless 0 0 0  Suicidal thoughts 0 0 0  PHQ-9 Score '1 1 1  '$ Difficult doing work/chores Not difficult at all Not difficult at all Not difficult at all    Past Medical History:  Diagnosis Date   Allergy    Anxiety    Colon polyps    Depression    Past Surgical History:  Procedure Laterality Date   COLONOSCOPY WITH PROPOFOL N/A 11/06/2019   Procedure: COLONOSCOPY WITH PROPOFOL;  Surgeon: Lesly Rubenstein, MD;  Location: [facility] ENDOSCOPY;  Service: Endoscopy;  Laterality: N/A;   GALLBLADDER SURGERY  1974   INTRAMEDULLARY (IM) NAIL INTERTROCHANTERIC Right 08/31/2021   Procedure: INTRAMEDULLARY (IM) NAIL INTERTROCHANTRIC;  Surgeon: Renee Harder, MD;  Location: [facility] ORS;  Service: Orthopedics;  Laterality: Right;   TONSILLECTOMY Bilateral 1960   Social History   Socioeconomic History   Marital status: Married    Spouse  name: Not on file   Number of children: Not on file   Years of education: High School   Highest education level: High school graduate  Occupational History   Not on file  Tobacco Use   Smoking status: Every Day    Packs/day: 1.00    Years: 50.00    Total pack years: 50.00    Types: Cigarettes   Smokeless tobacco: Current  Vaping Use   Vaping Use: Never used  Substance and Sexual Activity   Alcohol use: Yes    Alcohol/week: 1.0 standard drink of alcohol    Types: 1 Standard drinks or equivalent per week   Drug use: Never    Sexual activity: Not on file  Other Topics Concern   Not on file  Social History Narrative   Not on file   Social Determinants of Health   Financial Resource Strain: Not on file  Food Insecurity: Not on file  Transportation Needs: Not on file  Physical Activity: Not on file  Stress: Not on file  Social Connections: Not on file  Intimate Partner Violence: Not on file   Family History  Problem Relation Age of Onset   Leukemia Mother    Dementia Father    Breast cancer Daughter    Current Outpatient Medications on File Prior to Visit  Medication Sig   Cholecalciferol (VITAMIN D3) 50 MCG (2000 UT) TABS Take by mouth.   traZODone (DESYREL) 100 MG tablet Take 1 tablet (100 mg total) by mouth at bedtime.   No current facility-administered medications on file prior to visit.    Review of Systems Per HPI unless specifically indicated above      Objective:    BP 112/64   Pulse 79   Ht '5\' 1"'$  (1.549 m)   Wt 110 lb 12.8 oz (50.3 kg)   LMP  (LMP Unknown)   SpO2 99%   BMI 20.94 kg/m   Wt Readings from Last 3 Encounters:  05/14/22 110 lb 12.8 oz (50.3 kg)  11/03/21 141 lb 6.4 oz (64.1 kg)  08/30/21 154 lb 12.2 oz (70.2 kg)    Physical Exam Vitals and nursing note reviewed.  Constitutional:      General: She is not in acute distress.    Appearance: She is well-developed. She is not diaphoretic.     Comments: Well-appearing, thin elderly female 86 yr comfortable, cooperative  HENT:     Head: Normocephalic and atraumatic.  Eyes:     General:        Right eye: No discharge.        Left eye: No discharge.     Conjunctiva/sclera: Conjunctivae normal.  Neck:     Thyroid: No thyromegaly.  Cardiovascular:     Rate and Rhythm: Normal rate and regular rhythm.     Heart sounds: Normal heart sounds. No murmur heard. Pulmonary:     Effort: Pulmonary effort is normal. No respiratory distress.     Breath sounds: Normal breath sounds. No wheezing or rales.  Musculoskeletal:         General: Normal range of motion.     Cervical back: Normal range of motion and neck supple.     Right lower leg: Edema (reduced edema) present.     Left lower leg: No edema.  Lymphadenopathy:     Cervical: No cervical adenopathy.  Skin:    General: Skin is warm and dry.     Findings: No erythema or rash.  Neurological:  Mental Status: She is alert and oriented to person, place, and time.  Psychiatric:        Behavior: Behavior normal.     Comments: Well groomed, good eye contact, normal speech and thoughts      Results for orders placed or performed in visit on 10/27/21  TSH  Result Value Ref Range   TSH 0.80 0.40 - 4.50 mIU/L  Hemoglobin A1c  Result Value Ref Range   Hgb A1c MFr Bld 5.2 <5.7 % of total Hgb   Mean Plasma Glucose 103 mg/dL   eAG (mmol/L) 5.7 mmol/L  Lipid panel  Result Value Ref Range   Cholesterol 160 <200 mg/dL   HDL 62 > OR = 50 mg/dL   Triglycerides 128 <150 mg/dL   LDL Cholesterol (Calc) 77 mg/dL (calc)   Total CHOL/HDL Ratio 2.6 <5.0 (calc)   Non-HDL Cholesterol (Calc) 98 <130 mg/dL (calc)  CBC with Differential/Platelet  Result Value Ref Range   WBC 9.2 3.8 - 10.8 Thousand/uL   RBC 4.24 3.80 - 5.10 Million/uL   Hemoglobin 13.8 11.7 - 15.5 g/dL   HCT 42.1 35.0 - 45.0 %   MCV 99.3 80.0 - 100.0 fL   MCH 32.5 27.0 - 33.0 pg   MCHC 32.8 32.0 - 36.0 g/dL   RDW 12.0 11.0 - 15.0 %   Platelets 378 140 - 400 Thousand/uL   MPV 10.5 7.5 - 12.5 fL   Neutro Abs 5,732 1,500 - 7,800 cells/uL   Lymphs Abs 2,696 850 - 3,900 cells/uL   Absolute Monocytes 497 200 - 950 cells/uL   Eosinophils Absolute 212 15 - 500 cells/uL   Basophils Absolute 64 0 - 200 cells/uL   Neutrophils Relative % 62.3 %   Total Lymphocyte 29.3 %   Monocytes Relative 5.4 %   Eosinophils Relative 2.3 %   Basophils Relative 0.7 %  COMPLETE METABOLIC PANEL WITH GFR  Result Value Ref Range   Glucose, Bld 93 65 - 99 mg/dL   BUN 6 (L) 7 - 25 mg/dL   Creat 0.72 0.60 - 1.00 mg/dL    eGFR 89 > OR = 60 mL/min/1.64m   BUN/Creatinine Ratio 8 6 - 22 (calc)   Sodium 138 135 - 146 mmol/L   Potassium 4.3 3.5 - 5.3 mmol/L   Chloride 102 98 - 110 mmol/L   CO2 27 20 - 32 mmol/L   Calcium 9.5 8.6 - 10.4 mg/dL   Total Protein 7.0 6.1 - 8.1 g/dL   Albumin 4.2 3.6 - 5.1 g/dL   Globulin 2.8 1.9 - 3.7 g/dL (calc)   AG Ratio 1.5 1.0 - 2.5 (calc)   Total Bilirubin 0.3 0.2 - 1.2 mg/dL   Alkaline phosphatase (APISO) 154 (H) 37 - 153 U/L   AST 17 10 - 35 U/L   ALT 19 6 - 29 U/L      Assessment & Plan:   Problem List Items Addressed This Visit     Major depression, recurrent, full remission ([place]   Tobacco abuse   Relevant Orders   Ambulatory Referral Lung Cancer Screening [facility]   Other Visit Diagnoses     Pure hypercholesterolemia    -  Primary   Screening for lung cancer       Relevant Orders   Ambulatory Referral Lung Cancer Screening [facility]   Abnormal weight loss       Mild protein-calorie malnutrition ([place]           Post op s/p R Femur  closed fracture repair Followed by Orthopedics Improved mobility and pain Swelling improved, still episodic flares  Labs reviewed Cholesterol and A1c sugar both improved, w weight loss  Weight improved Oral intake improved  Tobacco Abuse Active smoker Not interested in quitting 50 pack year+ smoking history Proceed w/ referral to [facility] for LDCT screening  Depression Stable on current therapy No changes today   Orders Placed This Encounter  Procedures   Ambulatory Referral Lung Cancer Screening [facility]    Referral Priority:   Routine    Referral Type:   Consultation    Referral Reason:   Specialty Services Required    Number of Visits Requested:   1     No orders of the defined types were placed in this encounter.    Follow up plan: Return in about 6 months (around 11/12/2022) for 6 month fasting lab only then 1 week later Annual Physical.  Future labs 6 months  10/2022   Nobie Putnam, DO [facility] 05/14/2022, 1:31 PM

## 2022-08-24 ENCOUNTER — Ambulatory Visit: Payer: Self-pay

## 2022-08-24 DIAGNOSIS — L237 Allergic contact dermatitis due to plants, except food: Secondary | ICD-10-CM

## 2022-08-24 MED ORDER — PREDNISONE 20 MG PO TABS: ORAL_TABLET | ORAL | 0 refills | Status: DC

## 2022-08-24 NOTE — Telephone Encounter (Signed)
Please notify patient. I will agree this time to send rx without virtual visit for Prednisone taper 7 days. Sent [facility] to [facility] location  Saralyn Pilar, DO [facility] 08/24/2022, 11:32 AM

## 2022-08-24 NOTE — Telephone Encounter (Signed)
The pt was notified that her prescription was sent over to the [facility]. She verbalize understanding, no questions or concerns.

## 2022-08-24 NOTE — Telephone Encounter (Signed)
     Chief Complaint: Poison ivy rash "all over from working outside. I have blisters and my eyes are puffy." No rash on face. Pt. Is out of town and asking for medication to be called in. Declines video visit.  Symptoms: Above Frequency: 2-3 days ago Pertinent Negatives: Patient denies fever Disposition: [] ED /[] Urgent Care (no appt availability in office) / [] Appointment(In office/virtual)/ []  [place] [facility]/ [] Home Care/ [] Refused Recommended Disposition /[] [facility] [facility]/ [x]  Follow-up with PCP Additional Notes: Asking for medication to be sent to [facility] [place] [facility] [phone].  Answer Assessment - Initial Assessment Questions 1. APPEARANCE of RASH: "Describe the rash." (e.g., spots, blisters, raised areas, skin peeling, scaly)     Blisters 2. SIZE: "How big are the spots?" (e.g., tip of pen, eraser, coin; inches, centimeters)     Different sizes 3. LOCATION: "Where is the rash located?"     All over 4. COLOR: "What color is the rash?" (Note: It is difficult to assess rash color in people with darker-colored skin. When this situation occurs, simply ask the caller to describe what they see.)     Red 5. ONSET: "When did the rash begin?"     2-3 days ago 6. FEVER: "Do you have a fever?" If Yes, ask: "What is your temperature, how was it measured, and when did it start?"     Unsure 7. ITCHING: "Does the rash itch?" If Yes, ask: "How bad is the itch?" (Scale 1-10; or mild, moderate, severe)     Severe 8. CAUSE: "What do you think is causing the rash?"     Poison ivy 9. MEDICINE FACTORS: "Have you started any new medicines within the last 2 weeks?" (e.g., antibiotics)      No 10. OTHER SYMPTOMS: "Do you have any other symptoms?" (e.g., dizziness, headache, sore throat, joint pain)       No 11. PREGNANCY: "Is there any chance you are pregnant?" "When was your last menstrual period?"       No  Protocols used: Rash or Redness - [facility]

## 2022-09-05 ENCOUNTER — Ambulatory Visit: Payer: Medicare Other | Admitting: Dermatology

## 2022-09-05 VITALS — BP 117/76 | HR 89

## 2022-09-05 DIAGNOSIS — L570 Actinic keratosis: Secondary | ICD-10-CM | POA: Diagnosis not present

## 2022-09-05 DIAGNOSIS — Z872 Personal history of diseases of the skin and subcutaneous tissue: Secondary | ICD-10-CM | POA: Diagnosis not present

## 2022-09-05 DIAGNOSIS — W908XXA Exposure to other nonionizing radiation, initial encounter: Secondary | ICD-10-CM | POA: Diagnosis not present

## 2022-09-05 DIAGNOSIS — L578 Other skin changes due to chronic exposure to nonionizing radiation: Secondary | ICD-10-CM

## 2022-09-05 DIAGNOSIS — Z1283 Encounter for screening for malignant neoplasm of skin: Secondary | ICD-10-CM | POA: Diagnosis not present

## 2022-09-05 DIAGNOSIS — L821 Other seborrheic keratosis: Secondary | ICD-10-CM | POA: Diagnosis not present

## 2022-09-05 DIAGNOSIS — M6740 Ganglion, unspecified site: Secondary | ICD-10-CM | POA: Diagnosis not present

## 2022-09-05 DIAGNOSIS — D229 Melanocytic nevi, unspecified: Secondary | ICD-10-CM

## 2022-09-05 DIAGNOSIS — Z7189 Other specified counseling: Secondary | ICD-10-CM

## 2022-09-05 DIAGNOSIS — L814 Other melanin hyperpigmentation: Secondary | ICD-10-CM

## 2022-09-05 DIAGNOSIS — L820 Inflamed seborrheic keratosis: Secondary | ICD-10-CM

## 2022-09-05 DIAGNOSIS — D1801 Hemangioma of skin and subcutaneous tissue: Secondary | ICD-10-CM

## 2022-09-05 NOTE — Patient Instructions (Signed)
Due to recent changes in healthcare laws, you may see results of your pathology and/or laboratory studies on MyChart before the doctors have had a chance to review them. We understand that in some cases there may be results that are confusing or concerning to you. Please understand that not all results are received at the same time and often the doctors may need to interpret multiple results in order to provide you with the best plan of care or course of treatment. Therefore, we ask that you please give us 2 business days to thoroughly review all your results before contacting the office for clarification. Should we see a critical lab result, you will be contacted sooner.   If You Need Anything After Your Visit  If you have any questions or concerns for your doctor, please call our main line at [phone] and press option 4 to reach your doctor's medical assistant. If no one answers, please leave a voicemail as directed and we will return your call as soon as possible. Messages left after 4 pm will be answered the following business day.   You may also send us a message via MyChart. We typically respond to MyChart messages within 1-2 business days.  For prescription refills, please ask your pharmacy to contact our office. Our fax number is [phone].  If you have an urgent issue when the clinic is closed that cannot wait until the next business day, you can page your doctor at the number below.    Please note that while we do our best to be available for urgent issues outside of office hours, we are not available 24/7.   If you have an urgent issue and are unable to reach us, you may choose to seek medical care at your doctor's office, retail clinic, urgent care center, or emergency room.  If you have a medical emergency, please immediately call 911 or go to the emergency department.  Pager Numbers  - Dr. Kowalski: [phone]  - Dr. Moye: [phone]  - Dr. Stewart:  [phone]  In the event of inclement weather, please call our main line at [phone] for an update on the status of any delays or closures.  Dermatology Medication Tips: Please keep the boxes that topical medications come in in order to help keep track of the instructions about where and how to use these. Pharmacies typically print the medication instructions only on the boxes and not directly on the medication tubes.   If your medication is too expensive, please contact our office at [phone] option 4 or send us a message through MyChart.   We are unable to tell what your co-pay for medications will be in advance as this is different depending on your insurance coverage. However, we may be able to find a substitute medication at lower cost or fill out paperwork to get insurance to cover a needed medication.   If a prior authorization is required to get your medication covered by your insurance company, please allow us 1-2 business days to complete this process.  Drug prices often vary depending on where the prescription is filled and some pharmacies may offer cheaper prices.  The website www.goodrx.com contains coupons for medications through different pharmacies. The prices here do not account for what the cost may be with help from insurance (it may be cheaper with your insurance), but the website can give you the price if you did not use any insurance.  - You can print the associated coupon and take it with   your prescription to the pharmacy.  - You may also stop by our office during regular business hours and pick up a GoodRx coupon card.  - If you need your prescription sent electronically to a different pharmacy, notify our office through [facility] MyChart or by phone at [phone] option 4.     Si Usted Necesita Algo Despus de Su Visita  Tambin puede enviarnos un mensaje a travs de MyChart. Por lo general respondemos a los mensajes de MyChart en el transcurso de 1 a 2  das hbiles.  Para renovar recetas, por favor pida a su farmacia que se ponga en contacto con nuestra oficina. Nuestro nmero de fax es el [phone].  Si tiene un asunto urgente cuando la clnica est cerrada y que no puede esperar hasta el siguiente da hbil, puede llamar/localizar a su doctor(a) al nmero que aparece a continuacin.   Por favor, tenga en cuenta que aunque hacemos todo lo posible para estar disponibles para asuntos urgentes fuera del horario de oficina, no estamos disponibles las 24 horas del da, los 7 das de la semana.   Si tiene un problema urgente y no puede comunicarse con nosotros, puede optar por buscar atencin mdica  en el consultorio de su doctor(a), en una clnica privada, en un centro de atencin urgente o en una sala de emergencias.  Si tiene una emergencia mdica, por favor llame inmediatamente al 911 o vaya a la sala de emergencias.  Nmeros de bper  - Dr. Kowalski: [phone]  - Dra. Moye: [phone]  - Dra. Stewart: [phone]  En caso de inclemencias del tiempo, por favor llame a nuestra lnea principal al [phone] para una actualizacin sobre el estado de cualquier retraso o cierre.  Consejos para la medicacin en dermatologa: Por favor, guarde las cajas en las que vienen los medicamentos de uso tpico para ayudarle a seguir las instrucciones sobre dnde y cmo usarlos. Las farmacias generalmente imprimen las instrucciones del medicamento slo en las cajas y no directamente en los tubos del medicamento.   Si su medicamento es muy caro, por favor, pngase en contacto con nuestra oficina llamando al [phone] y presione la opcin 4 o envenos un mensaje a travs de MyChart.   No podemos decirle cul ser su copago por los medicamentos por adelantado ya que esto es diferente dependiendo de la cobertura de su seguro. Sin embargo, es posible que podamos encontrar un medicamento sustituto a menor costo o llenar un formulario para que el  seguro cubra el medicamento que se considera necesario.   Si se requiere una autorizacin previa para que su compaa de seguros cubra su medicamento, por favor permtanos de 1 a 2 das hbiles para completar este proceso.  Los precios de los medicamentos varan con frecuencia dependiendo del lugar de dnde se surte la receta y alguna farmacias pueden ofrecer precios ms baratos.  El sitio web www.goodrx.com tiene cupones para medicamentos de diferentes farmacias. Los precios aqu no tienen en cuenta lo que podra costar con la ayuda del seguro (puede ser ms barato con su seguro), pero el sitio web puede darle el precio si no utiliz ningn seguro.  - Puede imprimir el cupn correspondiente y llevarlo con su receta a la farmacia.  - Tambin puede pasar por nuestra oficina durante el horario de atencin regular y recoger una tarjeta de cupones de GoodRx.  - Si necesita que su receta se enve electrnicamente a una farmacia diferente, informe a nuestra oficina a travs de MyChart de [place]   o por telfono llamando al [phone] y presione la opcin 4.  

## 2022-09-05 NOTE — Progress Notes (Signed)
Follow-Up Visit   Subjective  Brittany Weeks is a 72 y.o. female who presents for the following: Skin Cancer Screening and Full Body Skin Exam  The patient presents for Total-Body Skin Exam (TBSE) for skin cancer screening and mole check. The patient has spots, moles and lesions to be evaluated, some may be new or changing and the patient has concerns that these could be cancer.    The following portions of the chart were reviewed this encounter and updated as appropriate: medications, allergies, medical history  Review of Systems:  No other skin or systemic complaints except as noted in HPI or Assessment and Plan.  Objective  Well appearing patient in no apparent distress; mood and affect are within normal limits.  A full examination was performed including scalp, head, eyes, ears, nose, lips, neck, chest, axillae, abdomen, back, buttocks, bilateral upper extremities, bilateral lower extremities, hands, feet, fingers, toes, fingernails, and toenails. All findings within normal limits unless otherwise noted below.   Relevant physical exam findings are noted in the Assessment and Plan.  R temple x 1 Erythematous stuck-on, waxy papule or plaque  Upper lip vermillion border x 3 (3) Erythematous thin papules/macules with gritty scale.     Assessment & Plan   LENTIGINES, SEBORRHEIC KERATOSES, HEMANGIOMAS - Benign normal skin lesions - Benign-appearing - Call for any changes  MELANOCYTIC NEVI - Tan-brown and/or pink-flesh-colored symmetric macules and papules - Benign appearing on exam today - Observation - Call clinic for new or changing moles - Recommend daily use of broad spectrum spf 30+ sunscreen to sun-exposed areas.   ACTINIC DAMAGE - Chronic condition, secondary to cumulative UV/sun exposure - diffuse scaly erythematous macules with underlying dyspigmentation - Recommend daily broad spectrum sunscreen SPF 30+ to sun-exposed areas, reapply every 2 hours as needed.   - Staying in the shade or wearing long sleeves, sun glasses (UVA+UVB protection) and wide brim hats (4-inch brim around the entire circumference of the hat) are also recommended for sun protection.  - Call for new or changing lesions.  Rash - hx of allergic contact dermatitis  Exam: Clear  Differential diagnosis:  Patient states that patch testing done years ago showed positive allergy to black rubber mix.   Treatment Plan: None needed today.   Inflamed seborrheic keratosis R temple x 1  Symptomatic, irritating, patient would like treated.   Destruction of lesion - R temple x 1 Complexity: simple   Destruction method: cryotherapy   Informed consent: discussed and consent obtained   Timeout:  patient name, date of birth, surgical site, and procedure verified Lesion destroyed using liquid nitrogen: Yes   Region frozen until ice ball extended beyond lesion: Yes   Outcome: patient tolerated procedure well with no complications   Post-procedure details: wound care instructions given    AK (actinic keratosis) (3) Upper lip vermillion border x 3  Destruction of lesion - Upper lip vermillion border x 3 Complexity: simple   Destruction method: cryotherapy   Informed consent: discussed and consent obtained   Timeout:  patient name, date of birth, surgical site, and procedure verified Lesion destroyed using liquid nitrogen: Yes   Region frozen until ice ball extended beyond lesion: Yes   Outcome: patient tolerated procedure well with no complications   Post-procedure details: wound care instructions given    DIGITAL MUCOUS CYST Exam: Solitary, smooth skin colored to translucent papule.  A digital mucous cyst also known as a myxoid cyst or pseudocyst is a ganglion cyst arising from the  distal interphalangeal (DIP) joint of the finger or thumb (or, less commonly, toe). The cysts are believed to form from degeneration of connective tissue and are associated with osteoarthritic joints  or injury. Although the exact etiology is unknown, it is likely that a small tear forms in a joint capsule or tendon sheath, allowing extravasation of synovial fluid into the adjacent tissue. When the fluid reacts with local tissue, it becomes more gelatinous and a cyst wall forms. With any treatment, there is a high rate of recurrence.   Treatment options include: - Puncture / Incision & Drainage (I&D) - Intralesional steroid injection - Intralesional Sclerosant injection (Asclera/ Polidocanol) - Intralesional steroid + sclerosant - Corticosteroid tape - Cryosurgery - Laser (CO2) - Infrared photocoagulation - Excision / Surgery  Treatment Plan: Recommend patient following up with podiatrist.  SKIN CANCER SCREENING PERFORMED TODAY.  Return in about 5 months (around 02/05/2023) for ISK and AK follow up .  Maylene Roes, CMA, am acting as scribe for Armida Sans, MD .  Documentation: I have reviewed the above documentation for accuracy and completeness, and I agree with the above.  Armida Sans, MD

## 2022-09-07 ENCOUNTER — Encounter: Payer: Self-pay | Admitting: Emergency Medicine

## 2022-09-07 ENCOUNTER — Encounter: Payer: Self-pay | Admitting: Dermatology

## 2022-09-27 ENCOUNTER — Ambulatory Visit: Payer: Medicare Other | Admitting: Podiatry

## 2022-11-09 ENCOUNTER — Other Ambulatory Visit: Payer: Medicare Other

## 2022-11-14 ENCOUNTER — Encounter: Payer: Medicare Other | Admitting: Family Medicine

## 2022-12-17 DIAGNOSIS — H2513 Age-related nuclear cataract, bilateral: Secondary | ICD-10-CM | POA: Diagnosis not present

## 2023-01-28 ENCOUNTER — Ambulatory Visit: Payer: Medicare Other | Admitting: Dermatology

## 2023-01-28 DIAGNOSIS — L578 Other skin changes due to chronic exposure to nonionizing radiation: Secondary | ICD-10-CM

## 2023-01-28 DIAGNOSIS — L570 Actinic keratosis: Secondary | ICD-10-CM | POA: Diagnosis not present

## 2023-01-28 DIAGNOSIS — L821 Other seborrheic keratosis: Secondary | ICD-10-CM

## 2023-01-28 DIAGNOSIS — L820 Inflamed seborrheic keratosis: Secondary | ICD-10-CM

## 2023-01-28 DIAGNOSIS — L814 Other melanin hyperpigmentation: Secondary | ICD-10-CM | POA: Diagnosis not present

## 2023-01-28 DIAGNOSIS — W908XXA Exposure to other nonionizing radiation, initial encounter: Secondary | ICD-10-CM

## 2023-01-28 NOTE — Patient Instructions (Signed)

## 2023-01-28 NOTE — Progress Notes (Signed)
Follow-Up Visit   Subjective  Brittany Weeks is a 72 y.o. female who presents for the following: Recheck Aks of the upper lip vermilion border, and ISK on the R temple.   The patient has spots, moles and lesions to be evaluated, some may be new or changing and the patient may have concern these could be cancer.   The following portions of the chart were reviewed this encounter and updated as appropriate: medications, allergies, medical history  Review of Systems:  No other skin or systemic complaints except as noted in HPI or Assessment and Plan.  Objective  Well appearing patient in no apparent distress; mood and affect are within normal limits.  A focused examination was performed of the following areas: the face   Relevant exam findings are noted in the Assessment and Plan.  R upper med lip vermilion border x 1 Erythematous thin papules/macules with gritty scale.   R temple above the lat brow x 1, R med infra clavicular x 1 (2) Erythematous stuck-on, waxy papule or plaque    Assessment & Plan   ACTINIC DAMAGE - chronic, secondary to cumulative UV radiation exposure/sun exposure over time - diffuse scaly erythematous macules with underlying dyspigmentation - Recommend daily broad spectrum sunscreen SPF 30+ to sun-exposed areas, reapply every 2 hours as needed.  - Recommend staying in the shade or wearing long sleeves, sun glasses (UVA+UVB protection) and wide brim hats (4-inch brim around the entire circumference of the hat). - Call for new or changing lesions.  SEBORRHEIC KERATOSIS - Stuck-on, waxy, tan-brown papules and/or plaques  - Benign-appearing - Discussed benign etiology and prognosis. - Observe - Call for any changes  AK (actinic keratosis) R upper med lip vermilion border x 1  Actinic keratoses are precancerous spots that appear secondary to cumulative UV radiation exposure/sun exposure over time. They are chronic with expected duration over 1 year.  A portion of actinic keratoses will progress to squamous cell carcinoma of the skin. It is not possible to reliably predict which spots will progress to skin cancer and so treatment is recommended to prevent development of skin cancer.  Recommend daily broad spectrum sunscreen SPF 30+ to sun-exposed areas, reapply every 2 hours as needed.  Recommend staying in the shade or wearing long sleeves, sun glasses (UVA+UVB protection) and wide brim hats (4-inch brim around the entire circumference of the hat). Call for new or changing lesions.   Destruction of lesion - R upper med lip vermilion border x 1 Complexity: simple   Destruction method: cryotherapy   Informed consent: discussed and consent obtained   Timeout:  patient name, date of birth, surgical site, and procedure verified Lesion destroyed using liquid nitrogen: Yes   Region frozen until ice ball extended beyond lesion: Yes   Outcome: patient tolerated procedure well with no complications   Post-procedure details: wound care instructions given    Inflamed seborrheic keratosis (2) R temple above the lat brow x 1, R med infra clavicular x 1  Symptomatic, irritating, patient would like treated.   Destruction of lesion - R temple above the lat brow x 1, R med infra clavicular x 1 (2) Complexity: simple   Destruction method: cryotherapy   Informed consent: discussed and consent obtained   Timeout:  patient name, date of birth, surgical site, and procedure verified Lesion destroyed using liquid nitrogen: Yes   Region frozen until ice ball extended beyond lesion: Yes   Outcome: patient tolerated procedure well with no complications  Post-procedure details: wound care instructions given    LENTIGINES Exam: scattered tan macules Due to sun exposure Treatment Plan: Benign-appearing, observe. Recommend daily broad spectrum sunscreen SPF 30+ to sun-exposed areas, reapply every 2 hours as needed.  Call for any changes  Return for AK  follow up in 6-8 mths.  Maylene Roes, CMA, am acting as scribe for Armida Sans, MD .   Documentation: I have reviewed the above documentation for accuracy and completeness, and I agree with the above.  Armida Sans, MD

## 2023-01-31 ENCOUNTER — Ambulatory Visit: Payer: Medicare Other | Admitting: Dermatology

## 2023-02-04 ENCOUNTER — Encounter: Payer: Self-pay | Admitting: Dermatology

## 2023-02-20 ENCOUNTER — Other Ambulatory Visit: Payer: Self-pay | Admitting: Internal Medicine

## 2023-02-20 ENCOUNTER — Ambulatory Visit (INDEPENDENT_AMBULATORY_CARE_PROVIDER_SITE_OTHER): Payer: Medicare Other | Admitting: Internal Medicine

## 2023-02-20 ENCOUNTER — Encounter: Payer: Self-pay | Admitting: Internal Medicine

## 2023-02-20 VITALS — BP 110/62 | HR 80 | Ht 61.0 in | Wt 121.8 lb

## 2023-02-20 DIAGNOSIS — M80051S Age-related osteoporosis with current pathological fracture, right femur, sequela: Secondary | ICD-10-CM | POA: Diagnosis not present

## 2023-02-20 DIAGNOSIS — Z1231 Encounter for screening mammogram for malignant neoplasm of breast: Secondary | ICD-10-CM

## 2023-02-20 DIAGNOSIS — M810 Age-related osteoporosis without current pathological fracture: Secondary | ICD-10-CM | POA: Diagnosis not present

## 2023-02-20 DIAGNOSIS — R3129 Other microscopic hematuria: Secondary | ICD-10-CM

## 2023-02-20 DIAGNOSIS — Z0000 Encounter for general adult medical examination without abnormal findings: Secondary | ICD-10-CM

## 2023-02-20 DIAGNOSIS — F3342 Major depressive disorder, recurrent, in full remission: Secondary | ICD-10-CM

## 2023-02-20 DIAGNOSIS — Z8781 Personal history of (healed) traumatic fracture: Secondary | ICD-10-CM

## 2023-02-20 DIAGNOSIS — E7800 Pure hypercholesterolemia, unspecified: Secondary | ICD-10-CM

## 2023-02-20 DIAGNOSIS — Z720 Tobacco use: Secondary | ICD-10-CM

## 2023-02-20 DIAGNOSIS — R718 Other abnormality of red blood cells: Secondary | ICD-10-CM | POA: Diagnosis not present

## 2023-02-20 MED ORDER — TRAZODONE HCL 100 MG PO TABS: 100.0000 mg | ORAL_TABLET | Freq: Every day | ORAL | 3 refills | Status: DC

## 2023-02-20 NOTE — Assessment & Plan Note (Signed)
Not interested in lung cancer screening. Not interested in trying to quit

## 2023-02-20 NOTE — Patient Instructions (Signed)
Call [facility] to schedule your mammogram at [phone].  

## 2023-02-20 NOTE — Progress Notes (Signed)
Date:  02/20/2023   Name:  Brittany Weeks   DOB:  May 08, 1950   MRN:  [id number]   Chief Complaint: Establish Care (Patient set appt up to establish care with a PCP. Her last physical was 10/2021. She would like to be able to get a new physical soon as she has not had any labs in a year.) Brittany Weeks is a 72 y.o. female who presents today for her Complete Annual Exam. She feels fairly well. She reports exercising none. She reports she is sleeping well. Breast complaints none.   Mammogram: 11/2021 DEXA: 2023 osteopenia Colonoscopy: 10/2019  Health Maintenance Due  Topic Date Due   Medicare Annual Wellness (AWV)  Never done   DTaP/Tdap/Td (1 - Tdap) Never done   Lung Cancer Screening  Never done   MAMMOGRAM  11/28/2022    Immunization History  Administered Date(s) Administered   Pneumococcal Conjugate-13 10/02/2017   Pneumococcal Polysaccharide-23 10/07/2018    HPI  Review of Systems  Constitutional:  Positive for unexpected weight change (slowly gaining weight back that she had lost). Negative for chills, fatigue and fever.  HENT:  Positive for hearing loss (had hearing aids). Negative for trouble swallowing.   Eyes:  Negative for visual disturbance.  Respiratory:  Negative for chest tightness, shortness of breath and wheezing.   Cardiovascular:  Positive for leg swelling. Negative for chest pain and palpitations.  Gastrointestinal:  Negative for abdominal pain, diarrhea and vomiting.  Genitourinary:  Negative for dysuria.  Musculoskeletal:  Positive for arthralgias and gait problem (since femur fracture).  Neurological:  Negative for dizziness, light-headedness and headaches.  Psychiatric/Behavioral:  Negative for dysphoric mood and sleep disturbance. The patient is not nervous/anxious.      Lab Results  Component Value Date   NA 138 10/27/2021   K 4.3 10/27/2021   CO2 27 10/27/2021   GLUCOSE 93 10/27/2021   BUN 6 (L) 10/27/2021   CREATININE 0.72 10/27/2021    CALCIUM 9.5 10/27/2021   EGFR 89 10/27/2021   GFRNONAA >60 09/01/2021   Lab Results  Component Value Date   CHOL 160 10/27/2021   HDL 62 10/27/2021   LDLCALC 77 10/27/2021   TRIG 128 10/27/2021   CHOLHDL 2.6 10/27/2021   Lab Results  Component Value Date   TSH 0.80 10/27/2021   Lab Results  Component Value Date   HGBA1C 5.2 10/27/2021   Lab Results  Component Value Date   WBC 9.2 10/27/2021   HGB 13.8 10/27/2021   HCT 42.1 10/27/2021   MCV 99.3 10/27/2021   PLT 378 10/27/2021   Lab Results  Component Value Date   ALT 19 10/27/2021   AST 17 10/27/2021   ALKPHOS 55 08/30/2021   BILITOT 0.3 10/27/2021   No results found for: "25OHVITD2", "25OHVITD3", "VD25OH"   Patient Active Problem List   Diagnosis Date Noted   Osteoporotic fracture of right hip, sequela 02/20/2023   Microscopic hematuria 09/05/2021   Insomnia 08/30/2021   Elevated MCV 10/09/2019   Tobacco abuse 10/03/2017   Major depression, recurrent, full remission (HCC) 09/03/2017    Allergies  Allergen Reactions   Epoxytropine  [Scopolamine] Nausea Only    dizziness   Other     Other reaction(s): Cough Balsam of [place] and black rubber mix- pt states black rubber, "makes my skin split."   Nickel Rash    Past Surgical History:  Procedure Laterality Date   COLONOSCOPY WITH PROPOFOL N/A 11/06/2019   Procedure: COLONOSCOPY WITH PROPOFOL;  Surgeon: Regis Bill, MD;  Location: [facility] ENDOSCOPY;  Service: Endoscopy;  Laterality: N/A;   GALLBLADDER SURGERY  1974   INTRAMEDULLARY (IM) NAIL INTERTROCHANTERIC Right 08/31/2021   Procedure: INTRAMEDULLARY (IM) NAIL INTERTROCHANTRIC;  Surgeon: Ross Marcus, MD;  Location: [facility] ORS;  Service: Orthopedics;  Laterality: Right;   TONSILLECTOMY Bilateral 1960    Social History   Tobacco Use   Smoking status: Every Day    Current packs/day: 1.00    Average packs/day: 1 pack/day for 50.0 years (50.0 ttl pk-yrs)    Types: Cigarettes   Smokeless tobacco:  Current  Vaping Use   Vaping status: Never Used  Substance Use Topics   Alcohol use: Yes    Alcohol/week: 1.0 standard drink of alcohol    Types: 1 Standard drinks or equivalent per week   Drug use: Never     Medication list has been reviewed and updated.  Current Meds  Medication Sig   Cholecalciferol (VITAMIN D3) 50 MCG (2000 UT) TABS Take by mouth.   [DISCONTINUED] traZODone (DESYREL) 100 MG tablet Take 1 tablet (100 mg total) by mouth at bedtime.       02/20/2023    2:24 PM 11/03/2021    1:37 PM 10/07/2018    2:20 PM 10/02/2017    2:31 PM  GAD 7 : Generalized Anxiety Score  Nervous, Anxious, on Edge 0 0 0 0  Control/stop worrying 0 0 0 0  Worry too much - different things 0 0 0 0  Trouble relaxing 0 0 0 0  Restless 0 0 0 0  Easily annoyed or irritable 0 0 0 0  Afraid - awful might happen 0 0 0 0  Total GAD 7 Score 0 0 0 0  Anxiety Difficulty Not difficult at all Not difficult at all Not difficult at all Not difficult at all       02/20/2023    2:24 PM 05/14/2022    7:01 PM 11/03/2021    1:37 PM  Depression screen PHQ 2/9  Decreased Interest 0 0 0  Down, Depressed, Hopeless 0 0 0  PHQ - 2 Score 0 0 0  Altered sleeping 0 0 0  Tired, decreased energy 0 1 1  Change in appetite 0 0 0  Feeling bad or failure about yourself  0 0 0  Trouble concentrating 0 0 0  Moving slowly or fidgety/restless 0 0 0  Suicidal thoughts 0 0 0  PHQ-9 Score 0 1 1  Difficult doing work/chores Not difficult at all Not difficult at all Not difficult at all    BP Readings from Last 3 Encounters:  02/20/23 110/62  09/05/22 117/76  05/14/22 112/64    Physical Exam Vitals and nursing note reviewed.  Constitutional:      General: She is not in acute distress.    Appearance: She is well-developed.  HENT:     Head: Normocephalic and atraumatic.     Right Ear: Tympanic membrane and ear canal normal.     Left Ear: Tympanic membrane and ear canal normal.     Nose:     Right Sinus: No  maxillary sinus tenderness.     Left Sinus: No maxillary sinus tenderness.  Eyes:     General: No scleral icterus.       Right eye: No discharge.        Left eye: No discharge.     Conjunctiva/sclera: Conjunctivae normal.  Neck:     Thyroid: No thyromegaly.     Vascular:  No carotid bruit.  Cardiovascular:     Rate and Rhythm: Normal rate and regular rhythm.     Pulses: Normal pulses.     Heart sounds: Normal heart sounds.  Pulmonary:     Effort: Pulmonary effort is normal. No respiratory distress.     Breath sounds: No wheezing.  Abdominal:     General: Bowel sounds are normal.     Palpations: Abdomen is soft.     Tenderness: There is no abdominal tenderness.  Musculoskeletal:        General: Swelling (mild right ankle swelling) present.     Cervical back: Normal range of motion. No erythema.     Right lower leg: No edema.     Left lower leg: No edema.  Lymphadenopathy:     Cervical: No cervical adenopathy.  Skin:    General: Skin is warm and dry.     Findings: No rash.  Neurological:     Mental Status: She is alert and oriented to person, place, and time.     Cranial Nerves: No cranial nerve deficit.     Sensory: No sensory deficit.     Gait: Gait abnormal.     Deep Tendon Reflexes: Reflexes are normal and symmetric.  Psychiatric:        Attention and Perception: Attention normal.        Mood and Affect: Mood normal.     Wt Readings from Last 3 Encounters:  02/20/23 121 lb 12.8 oz (55.2 kg)  05/14/22 110 lb 12.8 oz (50.3 kg)  11/03/21 141 lb 6.4 oz (64.1 kg)    BP 110/62   Pulse 80   Ht 5\' 1"  (1.549 m)   Wt 121 lb 12.8 oz (55.2 kg)   LMP  (LMP Unknown)   SpO2 96%   BMI 23.01 kg/m   Assessment and Plan:  Problem List Items Addressed This Visit       Unprioritized   Elevated MCV   Relevant Orders   CBC with Differential/Platelet   Major depression, recurrent, full remission (HCC)    Clinically stable on Trazodone with good response, No SI or HI  reported. No change in management at this time.       Relevant Medications   traZODone (DESYREL) 100 MG tablet   Other Relevant Orders   Comprehensive metabolic panel   TSH   Microscopic hematuria   Relevant Orders   Urinalysis, Routine w reflex microscopic   Osteoporotic fracture of right hip, sequela    She has taken Fosamax for a number of years in the past but stopped due to medication side effect concerns (but no actual SE) She has not resumed since femur fracture DEXA last year showed osteopenia  - no previous to compare. Continue vitamin D; obtain level. DEXA next year.      Relevant Orders   VITAMIN D 25 Hydroxy (Vit-D Deficiency, Fractures)   Tobacco abuse    Not interested in lung cancer screening. Not interested in trying to quit      Other Visit Diagnoses     Annual physical exam    -  Primary   declines Covid and flu vaccines   Encounter for screening mammogram for breast cancer       Relevant Orders   MM 3D SCREENING MAMMOGRAM BILATERAL BREAST   Pure hypercholesterolemia       Relevant Orders   Lipid panel       Return in about 6 months (around 08/21/2023) for Depression.  Reubin Milan, MD [facility] [place]

## 2023-02-20 NOTE — Assessment & Plan Note (Signed)
Clinically stable on Trazodone with good response, No SI or HI reported. No change in management at this time.

## 2023-02-20 NOTE — Assessment & Plan Note (Signed)
She has taken Fosamax for a number of years in the past but stopped due to medication side effect concerns (but no actual SE) She has not resumed since femur fracture DEXA last year showed osteopenia  - no previous to compare. Continue vitamin D; obtain level. DEXA next year.

## 2023-02-21 LAB — URINALYSIS, ROUTINE W REFLEX MICROSCOPIC
Bilirubin, UA: NEGATIVE
Glucose, UA: NEGATIVE
Ketones, UA: NEGATIVE
Nitrite, UA: NEGATIVE
Protein,UA: NEGATIVE
Specific Gravity, UA: 1.02 (ref 1.005–1.030)
Urobilinogen, Ur: 0.2 mg/dL (ref 0.2–1.0)
pH, UA: 5.5 (ref 5.0–7.5)

## 2023-02-21 LAB — CBC WITH DIFFERENTIAL/PLATELET
Basophils Absolute: 0.1 10*3/uL (ref 0.0–0.2)
Basos: 1 %
EOS (ABSOLUTE): 0.2 10*3/uL (ref 0.0–0.4)
Eos: 2 %
Hematocrit: 42 % (ref 34.0–46.6)
Hemoglobin: 14.1 g/dL (ref 11.1–15.9)
Immature Grans (Abs): 0 10*3/uL (ref 0.0–0.1)
Immature Granulocytes: 0 %
Lymphocytes Absolute: 3.5 10*3/uL — ABNORMAL HIGH (ref 0.7–3.1)
Lymphs: 36 %
MCH: 33.3 pg — ABNORMAL HIGH (ref 26.6–33.0)
MCHC: 33.6 g/dL (ref 31.5–35.7)
MCV: 99 fL — ABNORMAL HIGH (ref 79–97)
Monocytes Absolute: 0.6 10*3/uL (ref 0.1–0.9)
Monocytes: 7 %
Neutrophils Absolute: 5.4 10*3/uL (ref 1.4–7.0)
Neutrophils: 54 %
Platelets: 270 10*3/uL (ref 150–450)
RBC: 4.24 x10E6/uL (ref 3.77–5.28)
RDW: 12 % (ref 11.7–15.4)
WBC: 9.7 10*3/uL (ref 3.4–10.8)

## 2023-02-21 LAB — MICROSCOPIC EXAMINATION
Casts: NONE SEEN /[LPF]
Epithelial Cells (non renal): >10 /[HPF] — AB (ref 0–10)

## 2023-02-21 LAB — LIPID PANEL
Chol/HDL Ratio: 2.9 {ratio} (ref 0.0–4.4)
Cholesterol, Total: 223 mg/dL — ABNORMAL HIGH (ref 100–199)
HDL: 77 mg/dL (ref >39)
LDL Chol Calc (NIH): 119 mg/dL — ABNORMAL HIGH (ref 0–99)
Triglycerides: 155 mg/dL — ABNORMAL HIGH (ref 0–149)
VLDL Cholesterol Cal: 27 mg/dL (ref 5–40)

## 2023-02-21 LAB — COMPREHENSIVE METABOLIC PANEL
ALT: 15 [IU]/L (ref 0–32)
AST: 17 [IU]/L (ref 0–40)
Albumin: 4.5 g/dL (ref 3.8–4.8)
Alkaline Phosphatase: 102 [IU]/L (ref 44–121)
BUN/Creatinine Ratio: 21 (ref 12–28)
BUN: 14 mg/dL (ref 8–27)
Bilirubin Total: 0.2 mg/dL (ref 0.0–1.2)
CO2: 26 mmol/L (ref 20–29)
Calcium: 9.4 mg/dL (ref 8.7–10.3)
Chloride: 102 mmol/L (ref 96–106)
Creatinine, Ser: 0.66 mg/dL (ref 0.57–1.00)
Globulin, Total: 2.5 g/dL (ref 1.5–4.5)
Glucose: 90 mg/dL (ref 70–99)
Potassium: 4.5 mmol/L (ref 3.5–5.2)
Sodium: 145 mmol/L — ABNORMAL HIGH (ref 134–144)
Total Protein: 7 g/dL (ref 6.0–8.5)
eGFR: 93 mL/min/{1.73_m2} (ref >59)

## 2023-02-21 LAB — VITAMIN D 25 HYDROXY (VIT D DEFICIENCY, FRACTURES): Vit D, 25-Hydroxy: 31.4 ng/mL (ref 30.0–100.0)

## 2023-02-21 LAB — TSH: TSH: 0.807 u[IU]/mL (ref 0.450–4.500)

## 2023-02-25 ENCOUNTER — Telehealth: Payer: Self-pay

## 2023-02-25 NOTE — Telephone Encounter (Signed)
Pt given lab results per notes of Dr.Berglund on 02/22/23. Pt verbalized understanding. Patient states she uses MyChart and was able to see the results and comments made by Dr. Judithann Graves.  Labs are good except for moderately high cholesterol and borderline high sodium.  Nothing needs to be done except reduce high fat food intake.  No needs to limit dietary sodium.  Vitamin D is just into the normal range so be sure to take the supplement daily.  Written by Reubin Milan, MD on 02/22/2023  1:11 PM EST

## 2023-02-26 ENCOUNTER — Telehealth: Payer: Self-pay | Admitting: Internal Medicine

## 2023-02-26 NOTE — Telephone Encounter (Signed)
Noted  KP 

## 2023-02-26 NOTE — Telephone Encounter (Signed)
Copied from CRM [id number]. Topic: General - Other >> Feb 26, 2023  8:54 AM Shon Hale wrote: Reason for CRM: Pt returned call about labs. Pt was informed of labs yesterday by NT. Patient had no further questions.

## 2023-03-26 IMAGING — CR DG HIP (WITH OR WITHOUT PELVIS) 2-3V*R*
3 series · 3 of 3 positions shown · non-contrast
Comparison: None Available.

CLINICAL DATA: Fall, pain

EXAM:
DG HIP (WITH OR WITHOUT PELVIS) 2-3V RIGHT

[pelvis ap]
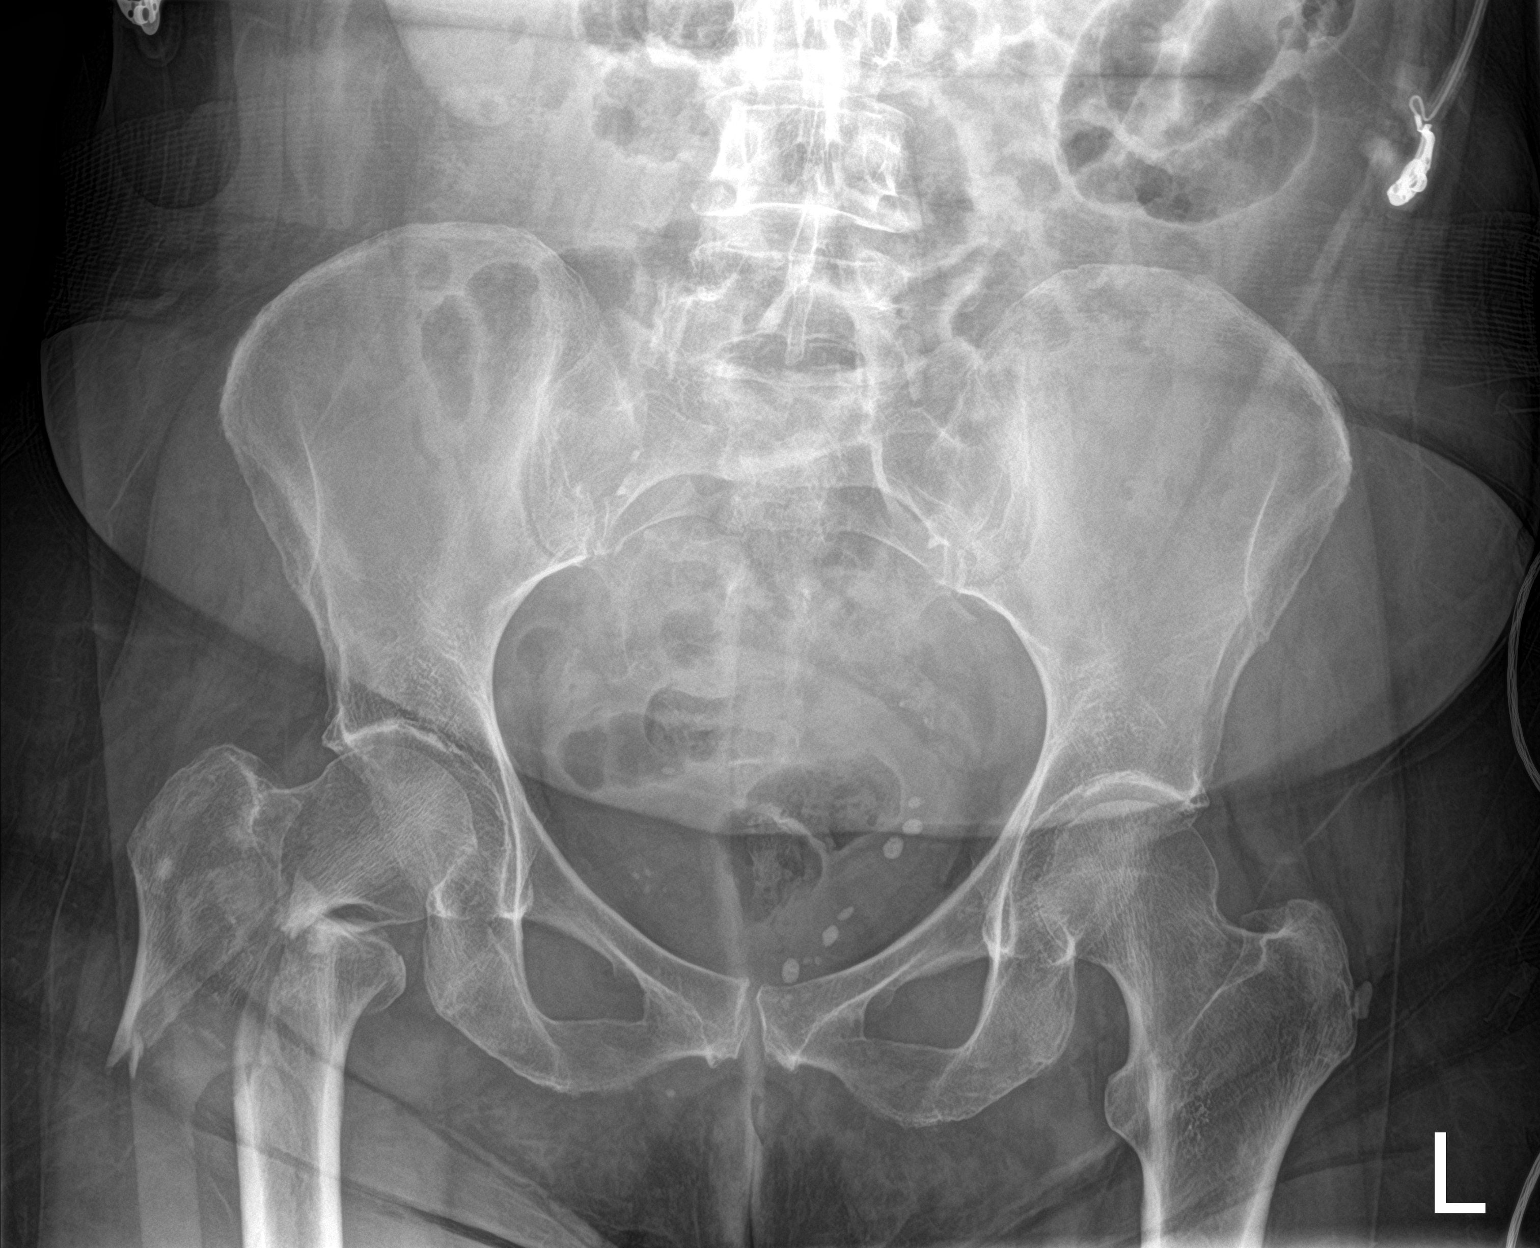

[hip ap]
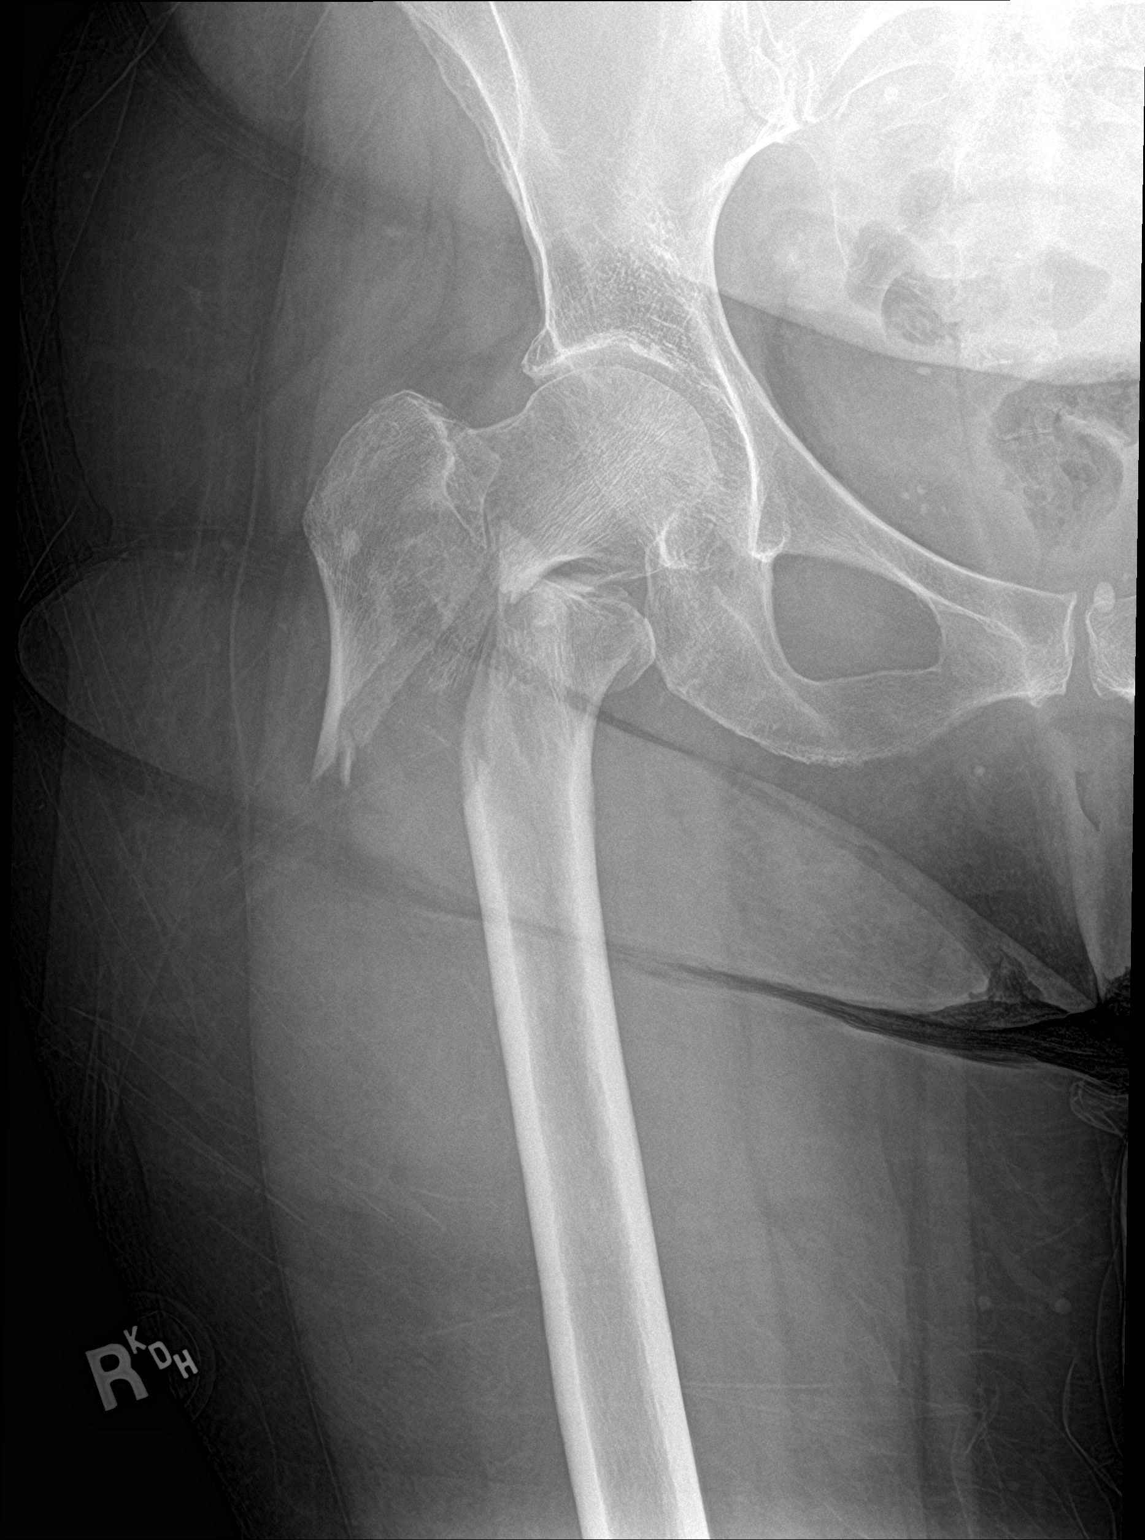

[hip lat]
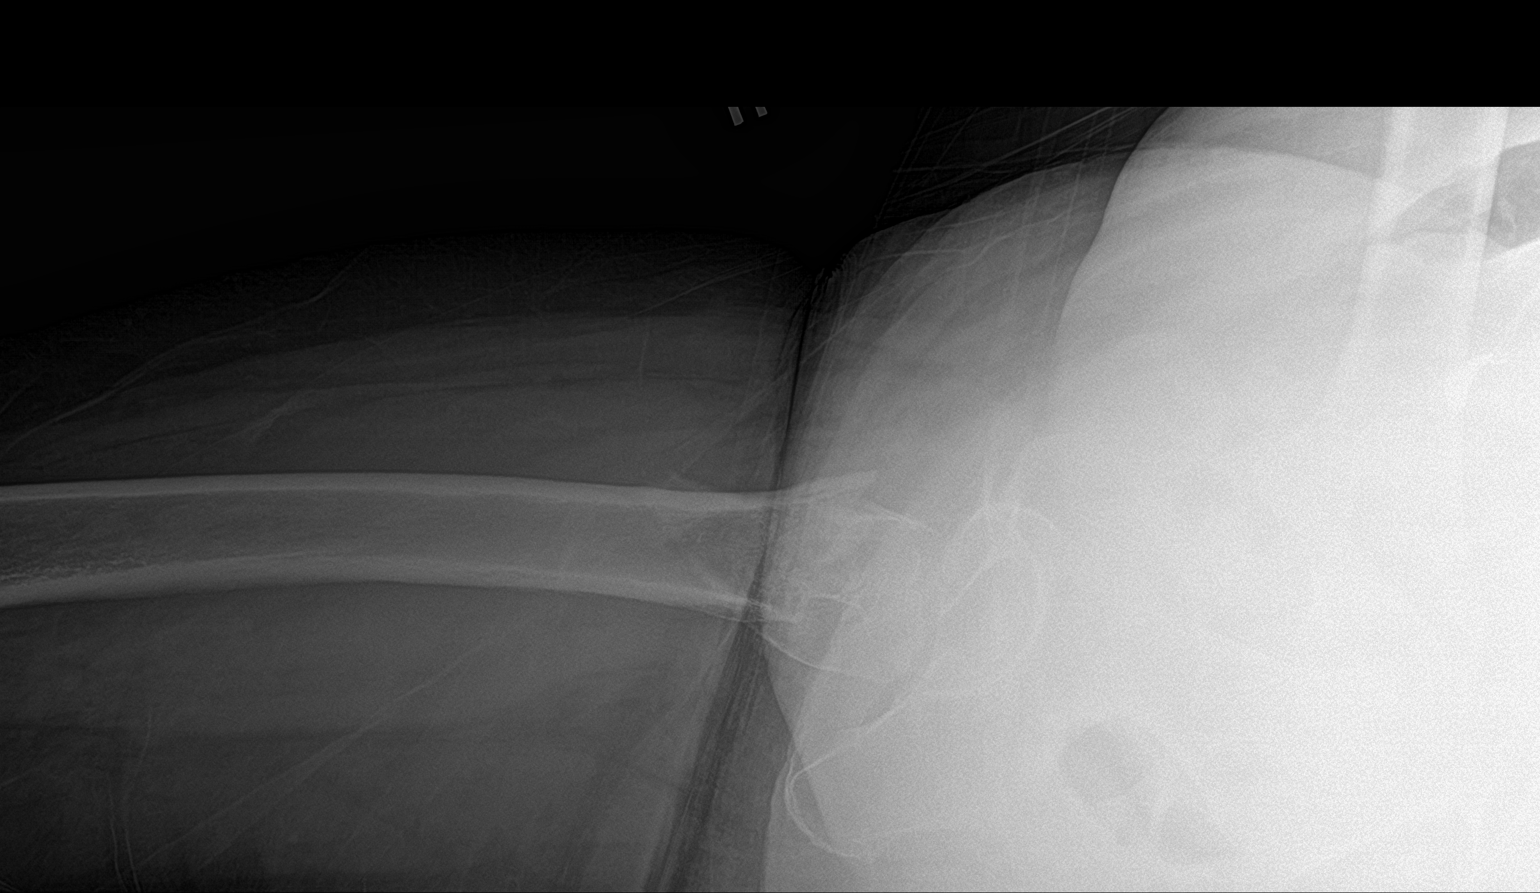

[3 of 3 positions shown; findings below may reference images not displayed]

FINDINGS: Displaced, comminuted, impacted subtrochanteric fractures of the
proximal right femur. The pelvis and proximal left femur appear
intact in single frontal view.
IMPRESSION: Displaced, comminuted, impacted subtrochanteric fractures of the
proximal right femur.

## 2023-04-03 ENCOUNTER — Telehealth: Payer: Self-pay | Admitting: Internal Medicine

## 2023-04-03 NOTE — Telephone Encounter (Signed)
 Copied from CRM [id number]. Topic: Medicare AWV >> Apr 03, 2023  1:53 PM Juliana Ocean wrote: Reason for CRM: Called LVM 04/03/2023 to schedule AWV. Please schedule office or virtual visits.  Rosalee Collins; Care Guide Ambulatory Clinical Support [place] l [facility] Direct Dial: [phone]

## 2023-05-28 ENCOUNTER — Ambulatory Visit
Admission: RE | Admit: 2023-05-28 | Discharge: 2023-05-28 | Disposition: A | Discharge status: Home / Self Care | Source: Ambulatory Visit | Attending: Internal Medicine | Admitting: Internal Medicine

## 2023-05-28 DIAGNOSIS — Z1231 Encounter for screening mammogram for malignant neoplasm of breast: Secondary | ICD-10-CM | POA: Insufficient documentation

## 2023-08-01 ENCOUNTER — Ambulatory Visit: Payer: Medicare Other | Admitting: Dermatology

## 2023-08-07 ENCOUNTER — Ambulatory Visit: Payer: Self-pay | Admitting: Internal Medicine

## 2023-08-13 ENCOUNTER — Telehealth: Payer: Self-pay | Admitting: Internal Medicine

## 2023-08-13 NOTE — Telephone Encounter (Signed)
 Copied from CRM [id number]. Topic: Medicare AWV >> Aug 13, 2023  2:40 PM Juliana Ocean wrote: Reason for CRM: LVM 08/13/23 to r/s WTM to a AWVI visit w/NHA Edgar Goods; Care Guide Ambulatory Clinical Support [place] l [facility] Direct Dial: [phone]

## 2023-08-14 ENCOUNTER — Telehealth: Payer: Self-pay | Admitting: Internal Medicine

## 2023-08-14 NOTE — Telephone Encounter (Signed)
 Copied from CRM [id number]. Topic: Medicare AWV >> Aug 14, 2023 10:49 AM Juliana Ocean wrote: Reason for CRM: Called 2X LVM 08/14/2023; 08/13/23 to r/s 02/21/24 WTM to a AWVI visit w/NHA Edgar Goods; Care Guide Ambulatory Clinical Support [place] l [facility] Direct Dial: [phone]

## 2023-08-20 ENCOUNTER — Telehealth: Payer: Self-pay | Admitting: Internal Medicine

## 2023-08-20 NOTE — Telephone Encounter (Signed)
 Copied from CRM [id number]. Topic: Medicare AWV >> Aug 20, 2023  1:45 PM Juliana Ocean wrote: Reason for CRM: LVM 08/21/2023 to schedule AWV. Please schedule Virtual or Telehealth visits ONLY.   Rosalee Collins; Care Guide Ambulatory Clinical Support [place] l [facility] Direct Dial: [phone]

## 2023-08-27 ENCOUNTER — Ambulatory Visit: Admitting: Dermatology

## 2023-08-27 ENCOUNTER — Encounter: Payer: Self-pay | Admitting: Dermatology

## 2023-08-27 DIAGNOSIS — W908XXA Exposure to other nonionizing radiation, initial encounter: Secondary | ICD-10-CM

## 2023-08-27 DIAGNOSIS — L570 Actinic keratosis: Secondary | ICD-10-CM

## 2023-08-27 DIAGNOSIS — L578 Other skin changes due to chronic exposure to nonionizing radiation: Secondary | ICD-10-CM

## 2023-08-27 DIAGNOSIS — D229 Melanocytic nevi, unspecified: Secondary | ICD-10-CM

## 2023-08-27 DIAGNOSIS — Z1283 Encounter for screening for malignant neoplasm of skin: Secondary | ICD-10-CM | POA: Diagnosis not present

## 2023-08-27 DIAGNOSIS — Z872 Personal history of diseases of the skin and subcutaneous tissue: Secondary | ICD-10-CM

## 2023-08-27 DIAGNOSIS — L814 Other melanin hyperpigmentation: Secondary | ICD-10-CM | POA: Diagnosis not present

## 2023-08-27 DIAGNOSIS — L820 Inflamed seborrheic keratosis: Secondary | ICD-10-CM | POA: Diagnosis not present

## 2023-08-27 DIAGNOSIS — L821 Other seborrheic keratosis: Secondary | ICD-10-CM | POA: Diagnosis not present

## 2023-08-27 DIAGNOSIS — D1801 Hemangioma of skin and subcutaneous tissue: Secondary | ICD-10-CM

## 2023-08-27 NOTE — Patient Instructions (Addendum)

## 2023-08-27 NOTE — Progress Notes (Signed)
 Follow-Up Visit   Subjective  Brittany Weeks is a 73 y.o. female who presents for the following: Skin Cancer Screening and Full Body Skin Exam, hx of precancers.   The patient presents for Total-Body Skin Exam (TBSE) for skin cancer screening and mole check. The patient has spots, moles and lesions to be evaluated, some may be new or changing and the patient may have concern these could be cancer.  The following portions of the chart were reviewed this encounter and updated as appropriate: medications, allergies, medical history  Review of Systems:  No other skin or systemic complaints except as noted in HPI or Assessment and Plan.  Objective  Well appearing patient in no apparent distress; mood and affect are within normal limits.  A full examination was performed including scalp, head, eyes, ears, nose, lips, neck, chest, axillae, abdomen, back, buttocks, bilateral upper extremities, bilateral lower extremities, hands, feet, fingers, toes, fingernails, and toenails. All findings within normal limits unless otherwise noted below.   Relevant physical exam findings are noted in the Assessment and Plan.  left forearm x 1, upper back, left forearm, face (6) Stuck-on, waxy, tan-brown papules and plaques -- Discussed benign etiology and prognosis.  left nose, right nose (2) Erythematous thin papules/macules with gritty scale.   Assessment & Plan   SKIN CANCER SCREENING PERFORMED TODAY.  LENTIGINES, SEBORRHEIC KERATOSES, HEMANGIOMAS - Benign normal skin lesions - Benign-appearing - Call for any changes  MELANOCYTIC NEVI - Tan-brown and/or pink-flesh-colored symmetric macules and papules - Benign appearing on exam today - Observation - Call clinic for new or changing moles - Recommend daily use of broad spectrum spf 30+ sunscreen to sun-exposed areas.   INFLAMED SEBORRHEIC KERATOSIS (6) left forearm x 1, upper back, left forearm, face (6) Symptomatic, irritating, patient would  like treated.   Recheck ISK treated on the left forearm in 8 weeks if not gone return to the office  Destruction of lesion - left forearm x 1, upper back, left forearm, face (6) Complexity: simple   Destruction method: cryotherapy   Informed consent: discussed and consent obtained   Timeout:  patient name, date of birth, surgical site, and procedure verified Lesion destroyed using liquid nitrogen: Yes   Region frozen until ice ball extended beyond lesion: Yes   Outcome: patient tolerated procedure well with no complications   Post-procedure details: wound care instructions given   AK (ACTINIC KERATOSIS) (2) left nose, 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  - Recommend staying in the shade or wearing long sleeves, sun glasses (UVA+UVB protection) and wide brim hats (4-inch brim around the entire circumference of the hat). - Call for new or changing lesions.  Destruction of lesion - left nose, right nose (2) Complexity: simple   Destruction method: cryotherapy   Informed consent: discussed and consent obtained   Timeout:  patient name, date of birth, surgical site, and procedure verified Lesion destroyed using liquid nitrogen: Yes   Region frozen until ice ball extended beyond lesion: Yes   Outcome: patient tolerated procedure well with no complications   Post-procedure details: wound care instructions given    Return in about 1 year (around 08/26/2024) for TBSE, hx of precancers .  IClara Crisp, CMA, am acting as scribe for Celine Collard, MD .   Documentation:  I have reviewed the above documentation for accuracy and completeness, and I agree with the above.  Celine Collard, MD

## 2023-12-18 DIAGNOSIS — H2513 Age-related nuclear cataract, bilateral: Secondary | ICD-10-CM | POA: Diagnosis not present

## 2024-02-20 NOTE — Progress Notes (Deleted)
 Date:  02/24/2024   Name:  Brittany Weeks   DOB:  1950-10-17   MRN:  [id number]   Chief Complaint: No chief complaint on file.  HPI  Review of Systems   Lab Results  Component Value Date   NA 145 (H) 02/20/2023   K 4.5 02/20/2023   CO2 26 02/20/2023   GLUCOSE 90 02/20/2023   BUN 14 02/20/2023   CREATININE 0.66 02/20/2023   CALCIUM  9.4 02/20/2023   EGFR 93 02/20/2023   GFRNONAA >60 09/01/2021   Lab Results  Component Value Date   CHOL 223 (H) 02/20/2023   HDL 77 02/20/2023   LDLCALC 119 (H) 02/20/2023   TRIG 155 (H) 02/20/2023   CHOLHDL 2.9 02/20/2023   Lab Results  Component Value Date   TSH 0.807 02/20/2023   Lab Results  Component Value Date   HGBA1C 5.2 10/27/2021   Lab Results  Component Value Date   WBC 9.7 02/20/2023   HGB 14.1 02/20/2023   HCT 42.0 02/20/2023   MCV 99 (H) 02/20/2023   PLT 270 02/20/2023   Lab Results  Component Value Date   ALT 15 02/20/2023   AST 17 02/20/2023   ALKPHOS 102 02/20/2023   BILITOT 0.2 02/20/2023   Lab Results  Component Value Date   VD25OH 31.4 02/20/2023     Patient Active Problem List   Diagnosis Date Noted   Osteoporotic fracture of right hip, sequela 02/20/2023   Microscopic hematuria 09/05/2021   Insomnia 08/30/2021   Elevated MCV 10/09/2019   Tobacco abuse 10/03/2017   Major depression, recurrent, full remission 09/03/2017    Allergies  Allergen Reactions   Epoxytropine  [Scopolamine] Nausea Only    dizziness   Other     Other reaction(s): Cough Balsam of [place] and black rubber mix- pt states black rubber, makes my skin split.   Nickel Rash    Past Surgical History:  Procedure Laterality Date   COLONOSCOPY WITH PROPOFOL  N/A 11/06/2019   Procedure: COLONOSCOPY WITH PROPOFOL ;  Surgeon: Maryruth Ole DASEN, MD;  Location: [facility] ENDOSCOPY;  Service: Endoscopy;  Laterality: N/A;   GALLBLADDER SURGERY  1974   INTRAMEDULLARY (IM) NAIL INTERTROCHANTERIC Right 08/31/2021   Procedure:  INTRAMEDULLARY (IM) NAIL INTERTROCHANTRIC;  Surgeon: Rollene Cough, MD;  Location: [facility] ORS;  Service: Orthopedics;  Laterality: Right;   TONSILLECTOMY Bilateral 1960    Social History   Tobacco Use   Smoking status: Every Day    Current packs/day: 1.00    Average packs/day: 1 pack/day for 50.0 years (50.0 ttl pk-yrs)    Types: Cigarettes   Smokeless tobacco: Current  Vaping Use   Vaping status: Never Used  Substance Use Topics   Alcohol use: Yes    Alcohol/week: 1.0 standard drink of alcohol    Types: 1 Standard drinks or equivalent per week   Drug use: Never     Medication list has been reviewed and updated.  No outpatient medications have been marked as taking for the 02/24/24 encounter (Appointment) with Justus Leita DEL, MD.       02/20/2023    2:24 PM 11/03/2021    1:37 PM 10/07/2018    2:20 PM 10/02/2017    2:31 PM  GAD 7 : Generalized Anxiety Score  Nervous, Anxious, on Edge 0 0 0 0  Control/stop worrying 0 0 0 0  Worry too much - different things 0 0 0 0  Trouble relaxing 0 0 0 0  Restless 0 0 0 0  Easily annoyed or irritable 0 0 0 0  Afraid - awful might happen 0 0 0 0  Total GAD 7 Score 0 0 0 0  Anxiety Difficulty Not difficult at all Not difficult at all Not difficult at all Not difficult at all       02/20/2023    2:24 PM 05/14/2022    7:01 PM 11/03/2021    1:37 PM  Depression screen PHQ 2/9  Decreased Interest 0 0 0  Down, Depressed, Hopeless 0 0 0  PHQ - 2 Score 0 0 0  Altered sleeping 0 0 0  Tired, decreased energy 0 1 1  Change in appetite 0 0 0  Feeling bad or failure about yourself  0 0 0  Trouble concentrating 0 0 0  Moving slowly or fidgety/restless 0 0 0  Suicidal thoughts 0 0 0  PHQ-9 Score 0  1  1   Difficult doing work/chores Not difficult at all Not difficult at all Not difficult at all     Data saved with a previous flowsheet row definition    BP Readings from Last 3 Encounters:  02/20/23 110/62  09/05/22 117/76  05/14/22  112/64    Physical Exam  Wt Readings from Last 3 Encounters:  02/20/23 121 lb 12.8 oz (55.2 kg)  05/14/22 110 lb 12.8 oz (50.3 kg)  11/03/21 141 lb 6.4 oz (64.1 kg)    LMP  (LMP Unknown)   Assessment and Plan:  Problem List Items Addressed This Visit   None   No follow-ups on file.    Leita HILARIO Adie, MD [facility] [place]

## 2024-02-21 ENCOUNTER — Encounter: Admitting: Internal Medicine

## 2024-02-24 ENCOUNTER — Encounter: Admitting: Internal Medicine

## 2024-02-25 ENCOUNTER — Ambulatory Visit: Admitting: Internal Medicine

## 2024-02-25 ENCOUNTER — Encounter: Payer: Self-pay | Admitting: Internal Medicine

## 2024-02-25 VITALS — BP 112/74 | HR 86 | Ht 61.0 in | Wt 139.0 lb

## 2024-02-25 DIAGNOSIS — R3129 Other microscopic hematuria: Secondary | ICD-10-CM

## 2024-02-25 DIAGNOSIS — E782 Mixed hyperlipidemia: Secondary | ICD-10-CM

## 2024-02-25 DIAGNOSIS — Z0000 Encounter for general adult medical examination without abnormal findings: Secondary | ICD-10-CM

## 2024-02-25 DIAGNOSIS — F3342 Major depressive disorder, recurrent, in full remission: Secondary | ICD-10-CM

## 2024-02-25 DIAGNOSIS — Z720 Tobacco use: Secondary | ICD-10-CM

## 2024-02-25 DIAGNOSIS — F5104 Psychophysiologic insomnia: Secondary | ICD-10-CM

## 2024-02-25 DIAGNOSIS — R718 Other abnormality of red blood cells: Secondary | ICD-10-CM

## 2024-02-25 DIAGNOSIS — Z1231 Encounter for screening mammogram for malignant neoplasm of breast: Secondary | ICD-10-CM

## 2024-02-25 MED ORDER — TRAZODONE HCL 100 MG PO TABS: 100.0000 mg | ORAL_TABLET | Freq: Every day | ORAL | 3 refills | Status: AC

## 2024-02-25 NOTE — Assessment & Plan Note (Signed)
 Responds well to treatment with Trazodone .

## 2024-02-25 NOTE — Assessment & Plan Note (Signed)
 Depression and anxiety symptoms are stable and well controlled on trazodone . No SI/HI reported. I recommend continuing the same medical regimen.

## 2024-02-25 NOTE — Assessment & Plan Note (Signed)
 She continues to smoke and denies shortness of breath, cough, etc. She is not interested in LDCT screening

## 2024-02-25 NOTE — Patient Instructions (Signed)
 Call [facility] to schedule your mammogram at [phone].

## 2024-02-25 NOTE — Assessment & Plan Note (Addendum)
 She has not seen any blood in the urine. Last work up in 2018.  If quantitatively more RBCs, would recommend repeat Urology evaluation since she continues to smoke.

## 2024-02-25 NOTE — Assessment & Plan Note (Addendum)
 LDL is  Lab Results  Component Value Date   LDLCALC 119 (H) 02/20/2023  10 yr risk is moderately elevated, primarily due to age. Current medication regimen is none - diet only.  She has gained a bit of weight recently and could be better with her diet. Goal LDL is < 130.

## 2024-02-25 NOTE — Progress Notes (Signed)
 Date:  02/25/2024   Name:  Brittany Weeks   DOB:  Jan 07, 1951   MRN:  [id number]   Chief Complaint: Annual Exam Brittany Weeks is a 73 y.o. female who presents today for her Complete Annual Exam. She feels well. She reports exercising. She reports she is sleeping well. Breast complaints - none.  She continues mammograms but declines vaccines and DEXA.    Health Maintenance  Topic Date Due   Medicare Annual Wellness Visit  Never done   COVID-19 Vaccine (1) 03/12/2024*   Zoster (Shingles) Vaccine (1 of 2) 05/25/2024*   Flu Shot  06/16/2024*   Screening for Lung Cancer  02/24/2025*   DTaP/Tdap/Td vaccine (1 - Tdap) 02/24/2025*   Breast Cancer Screening  05/27/2024   Colon Cancer Screening  11/05/2029   Pneumococcal Vaccine for age over 32  Completed   Osteoporosis screening with Bone Density Scan  Completed   Hepatitis C Screening  Completed   Meningitis B Vaccine  Aged Out  *Topic was postponed. The date shown is not the original due date.    HPI  Review of Systems  Constitutional:  Negative for fatigue and unexpected weight change.  HENT:  Negative for trouble swallowing.   Eyes:  Negative for visual disturbance.  Respiratory:  Negative for cough, chest tightness, shortness of breath and wheezing.   Cardiovascular:  Negative for chest pain, palpitations and leg swelling.  Gastrointestinal:  Negative for abdominal pain, constipation and diarrhea.  Genitourinary:  Negative for dysuria and hematuria.  Musculoskeletal:  Negative for arthralgias and myalgias.  Skin:  Positive for color change and rash.  Neurological:  Negative for dizziness, weakness, light-headedness and headaches.  Psychiatric/Behavioral:  Negative for dysphoric mood and sleep disturbance. The patient is not nervous/anxious.      Lab Results  Component Value Date   NA 145 (H) 02/20/2023   K 4.5 02/20/2023   CO2 26 02/20/2023   GLUCOSE 90 02/20/2023   BUN 14 02/20/2023   CREATININE 0.66 02/20/2023    CALCIUM  9.4 02/20/2023   EGFR 93 02/20/2023   GFRNONAA >60 09/01/2021   Lab Results  Component Value Date   CHOL 223 (H) 02/20/2023   HDL 77 02/20/2023   LDLCALC 119 (H) 02/20/2023   TRIG 155 (H) 02/20/2023   CHOLHDL 2.9 02/20/2023   Lab Results  Component Value Date   TSH 0.807 02/20/2023   Lab Results  Component Value Date   HGBA1C 5.2 10/27/2021   Lab Results  Component Value Date   WBC 9.7 02/20/2023   HGB 14.1 02/20/2023   HCT 42.0 02/20/2023   MCV 99 (H) 02/20/2023   PLT 270 02/20/2023   Lab Results  Component Value Date   ALT 15 02/20/2023   AST 17 02/20/2023   ALKPHOS 102 02/20/2023   BILITOT 0.2 02/20/2023   Lab Results  Component Value Date   VD25OH 31.4 02/20/2023     Patient Active Problem List   Diagnosis Date Noted   Osteoporotic fracture of right hip, sequela 02/20/2023   Microscopic hematuria 09/05/2021   Insomnia 08/30/2021   Elevated MCV 10/09/2019   Tobacco abuse 10/03/2017   Mixed hyperlipidemia 09/04/2017   Major depression, recurrent, full remission 09/03/2017    Allergies  Allergen Reactions   Epoxytropine  [Scopolamine] Nausea Only    dizziness   Other     Other reaction(s): Cough Balsam of [place] and black rubber mix- pt states black rubber, makes my skin split.   Nickel Rash  Past Surgical History:  Procedure Laterality Date   COLONOSCOPY WITH PROPOFOL  N/A 11/06/2019   Procedure: COLONOSCOPY WITH PROPOFOL ;  Surgeon: Maryruth Ole DASEN, MD;  Location: [facility];  Service: Endoscopy;  Laterality: N/A;   GALLBLADDER SURGERY  1974   INTRAMEDULLARY (IM) NAIL INTERTROCHANTERIC Right 08/31/2021   Procedure: INTRAMEDULLARY (IM) NAIL INTERTROCHANTRIC;  Surgeon: Rollene Cough, MD;  Location: [facility] ORS;  Service: Orthopedics;  Laterality: Right;   TONSILLECTOMY Bilateral 1960    Social History   Tobacco Use   Smoking status: Every Day    Average packs/day: 1 pack/day for 50.0 years (50.0 ttl pk-yrs)    Types:  Cigarettes    Start date: 03/19/1964   Smokeless tobacco: Current  Vaping Use   Vaping status: Never Used  Substance Use Topics   Alcohol use: Yes    Alcohol/week: 1.0 standard drink of alcohol    Types: 1 Standard drinks or equivalent per week   Drug use: Never     Medication list has been reviewed and updated.  Current Meds  Medication Sig   Cholecalciferol (VITAMIN D3) 50 MCG (2000 UT) TABS Take by mouth.   [DISCONTINUED] traZODone  (DESYREL ) 100 MG tablet Take 1 tablet (100 mg total) by mouth at bedtime.       02/25/2024    2:27 PM 02/20/2023    2:24 PM 11/03/2021    1:37 PM 10/07/2018    2:20 PM  GAD 7 : Generalized Anxiety Score  Nervous, Anxious, on Edge 0 0 0 0  Control/stop worrying 0 0 0 0  Worry too much - different things 0 0 0 0  Trouble relaxing 0 0 0 0  Restless 0 0 0 0  Easily annoyed or irritable 0 0 0 0  Afraid - awful might happen 0 0 0 0  Total GAD 7 Score 0 0 0 0  Anxiety Difficulty Not difficult at all Not difficult at all Not difficult at all Not difficult at all       02/25/2024    2:27 PM 02/20/2023    2:24 PM 05/14/2022    7:01 PM  Depression screen PHQ 2/9  Decreased Interest 0 0 0  Down, Depressed, Hopeless 0 0 0  PHQ - 2 Score 0 0 0  Altered sleeping 0 0 0  Tired, decreased energy 0 0 1  Change in appetite 0 0 0  Feeling bad or failure about yourself  0 0 0  Trouble concentrating 0 0 0  Moving slowly or fidgety/restless 0 0 0  Suicidal thoughts 0 0 0  PHQ-9 Score 0 0  1   Difficult doing work/chores Not difficult at all Not difficult at all Not difficult at all     Data saved with a previous flowsheet row definition    BP Readings from Last 3 Encounters:  02/25/24 112/74  02/20/23 110/62  09/05/22 117/76    Physical Exam Vitals and nursing note reviewed.  Constitutional:      General: She is not in acute distress.    Appearance: She is well-developed.  HENT:     Head: Normocephalic and atraumatic.     Right Ear: Tympanic  membrane and ear canal normal.     Left Ear: Tympanic membrane and ear canal normal.     Nose:     Right Sinus: No maxillary sinus tenderness.     Left Sinus: No maxillary sinus tenderness.  Eyes:     General: No scleral icterus.       Right  eye: No discharge.        Left eye: No discharge.     Conjunctiva/sclera: Conjunctivae normal.  Neck:     Thyroid : No thyromegaly.     Vascular: No carotid bruit.  Cardiovascular:     Rate and Rhythm: Normal rate and regular rhythm.     Pulses: Normal pulses.     Heart sounds: Normal heart sounds.  Pulmonary:     Effort: Pulmonary effort is normal. No respiratory distress.     Breath sounds: No wheezing.  Abdominal:     General: Bowel sounds are normal.     Palpations: Abdomen is soft.     Tenderness: There is no abdominal tenderness.  Musculoskeletal:     Cervical back: Normal range of motion. No erythema.     Right lower leg: No edema.     Left lower leg: No edema.  Lymphadenopathy:     Cervical: No cervical adenopathy.  Skin:    General: Skin is warm and dry.     Findings: Lesion (scattered red/pink scaly lesions on lower legs) present. No rash.  Neurological:     Mental Status: She is alert and oriented to person, place, and time.     Cranial Nerves: No cranial nerve deficit.     Sensory: No sensory deficit.     Gait: Gait abnormal.     Deep Tendon Reflexes: Reflexes are normal and symmetric.  Psychiatric:        Attention and Perception: Attention normal.        Mood and Affect: Mood normal.        Behavior: Behavior normal.     Wt Readings from Last 3 Encounters:  02/25/24 139 lb (63 kg)  02/20/23 121 lb 12.8 oz (55.2 kg)  05/14/22 110 lb 12.8 oz (50.3 kg)    BP 112/74   Pulse 86   Ht 5' 1 (1.549 m)   Wt 139 lb (63 kg)   LMP  (LMP Unknown)   SpO2 94%   BMI 26.26 kg/m   Assessment and Plan:  Problem List Items Addressed This Visit       Unprioritized   Major depression, recurrent, full remission    Depression and anxiety symptoms are stable and well controlled on trazodone . No SI/HI reported. I recommend continuing the same medical regimen.       Relevant Medications   traZODone  (DESYREL ) 100 MG tablet   Mixed hyperlipidemia   LDL is  Lab Results  Component Value Date   LDLCALC 119 (H) 02/20/2023  10 yr risk is moderately elevated, primarily due to age. Current medication regimen is none - diet only.  She has gained a bit of weight recently and could be better with her diet. Goal LDL is < 130.         Relevant Orders   Comprehensive metabolic panel with GFR   Lipid panel   Tobacco abuse   She continues to smoke and denies shortness of breath, cough, etc. She is not interested in LDCT screening      Elevated MCV   Relevant Orders   CBC with Differential/Platelet   Insomnia   Responds well to treatment with Trazodone .      Relevant Orders   TSH   Microscopic hematuria   She has not seen any blood in the urine. Last work up in 2018.  If quantitatively more RBCs, would recommend repeat Urology evaluation since she continues to smoke.      Relevant Orders  Urinalysis, Routine w reflex microscopic   Other Visit Diagnoses       Annual physical exam    -  Primary   she declines immunizations and DEXA will schedule MM     Encounter for screening mammogram for breast cancer       Relevant Orders   MM 3D SCREENING MAMMOGRAM BILATERAL BREAST       Return in about 6 months (around 08/25/2024) for depression/insomnia.    Leita HILARIO Adie, MD [facility] [place]

## 2024-02-26 ENCOUNTER — Ambulatory Visit: Payer: Self-pay | Admitting: Internal Medicine

## 2024-02-26 LAB — CBC WITH DIFFERENTIAL/PLATELET
Basophils Absolute: 0.1 x10E3/uL (ref 0.0–0.2)
Basos: 1 %
EOS (ABSOLUTE): 0 x10E3/uL (ref 0.0–0.4)
Eos: 0 %
Hematocrit: 44.4 % (ref 34.0–46.6)
Hemoglobin: 14.4 g/dL (ref 11.1–15.9)
Immature Grans (Abs): 0 x10E3/uL (ref 0.0–0.1)
Immature Granulocytes: 0 %
Lymphocytes Absolute: 3.6 x10E3/uL — ABNORMAL HIGH (ref 0.7–3.1)
Lymphs: 35 %
MCH: 33.3 pg — ABNORMAL HIGH (ref 26.6–33.0)
MCHC: 32.4 g/dL (ref 31.5–35.7)
MCV: 103 fL — ABNORMAL HIGH (ref 79–97)
Monocytes Absolute: 0.6 x10E3/uL (ref 0.1–0.9)
Monocytes: 6 %
Neutrophils Absolute: 5.8 x10E3/uL (ref 1.4–7.0)
Neutrophils: 58 %
Platelets: 294 x10E3/uL (ref 150–450)
RBC: 4.33 x10E6/uL (ref 3.77–5.28)
RDW: 12.4 % (ref 11.7–15.4)
WBC: 10.1 x10E3/uL (ref 3.4–10.8)

## 2024-02-26 LAB — COMPREHENSIVE METABOLIC PANEL WITH GFR
ALT: 12 IU/L (ref 0–32)
AST: 15 IU/L (ref 0–40)
Albumin: 4.5 g/dL (ref 3.8–4.8)
Alkaline Phosphatase: 86 IU/L (ref 49–135)
BUN/Creatinine Ratio: 17 (ref 12–28)
BUN: 13 mg/dL (ref 8–27)
Bilirubin Total: 0.2 mg/dL (ref 0.0–1.2)
CO2: 26 mmol/L (ref 20–29)
Calcium: 9.6 mg/dL (ref 8.7–10.3)
Chloride: 102 mmol/L (ref 96–106)
Creatinine, Ser: 0.78 mg/dL (ref 0.57–1.00)
Globulin, Total: 2.6 g/dL (ref 1.5–4.5)
Glucose: 98 mg/dL (ref 70–99)
Potassium: 3.9 mmol/L (ref 3.5–5.2)
Sodium: 142 mmol/L (ref 134–144)
Total Protein: 7.1 g/dL (ref 6.0–8.5)
eGFR: 80 mL/min/1.73 (ref >59)

## 2024-02-26 LAB — MICROSCOPIC EXAMINATION
Casts: NONE SEEN /LPF
Epithelial Cells (non renal): >10 /HPF — AB (ref 0–10)

## 2024-02-26 LAB — URINALYSIS, ROUTINE W REFLEX MICROSCOPIC
Bilirubin, UA: NEGATIVE
Glucose, UA: NEGATIVE
Ketones, UA: NEGATIVE
Leukocytes,UA: NEGATIVE
Nitrite, UA: NEGATIVE
Protein,UA: NEGATIVE
Specific Gravity, UA: 1.014 (ref 1.005–1.030)
Urobilinogen, Ur: 0.2 mg/dL (ref 0.2–1.0)
pH, UA: 5.5 (ref 5.0–7.5)

## 2024-02-26 LAB — LIPID PANEL
Chol/HDL Ratio: 2.8 ratio (ref 0.0–4.4)
Cholesterol, Total: 217 mg/dL — ABNORMAL HIGH (ref 100–199)
HDL: 78 mg/dL (ref >39)
LDL Chol Calc (NIH): 127 mg/dL — ABNORMAL HIGH (ref 0–99)
Triglycerides: 69 mg/dL (ref 0–149)
VLDL Cholesterol Cal: 12 mg/dL (ref 5–40)

## 2024-02-26 LAB — TSH: TSH: 0.884 u[IU]/mL (ref 0.450–4.500)

## 2024-08-24 ENCOUNTER — Ambulatory Visit: Admitting: Student

## 2024-08-26 ENCOUNTER — Ambulatory Visit: Admitting: Dermatology
